# Patient Record
Sex: Female | Born: 1985 | Race: White | Hispanic: No | Marital: Single | Smoking: Former smoker
Health system: Southern US, Community
[De-identification: ages and names within clinical notes are randomized; demographics above are authoritative.]

## PROBLEM LIST (undated history)

## (undated) DIAGNOSIS — Z789 Other specified health status: Secondary | ICD-10-CM

## (undated) HISTORY — PX: TONGUE SURGERY: SHX810

---

## 2012-09-06 NOTE — L&D Delivery Note (Signed)
Delivery Note At 7:11 AM a viable female was delivered via Vaginal, Spontaneous Delivery (Presentation: ; Occiput Anterior).  APGAR: 8, 9; weight 7 lb 1.4 oz (3215 g).   Placenta status: Intact, Spontaneous.  Cord: 3 vessels with the following complications: None.  Cord pH: not collected  Anesthesia: Epidural  Episiotomy: None Lacerations: 1st degree Suture Repair: 3.0 vicryl Est. Blood Loss (mL): 300  Mom to postpartum.  Baby to Couplet care / Skin to Skin.  Routine postpartum  Breastfeeding  Outpatient circ  Haroldine Laws 07/15/2013, 9:09 AM

## 2013-04-19 LAB — OB RESULTS CONSOLE RUBELLA ANTIBODY, IGM: Rubella: IMMUNE

## 2013-04-19 LAB — OB RESULTS CONSOLE HEPATITIS B SURFACE ANTIGEN: Hepatitis B Surface Ag: NEGATIVE

## 2013-04-19 LAB — OB RESULTS CONSOLE RPR: RPR: NONREACTIVE

## 2013-04-19 LAB — OB RESULTS CONSOLE ANTIBODY SCREEN: Antibody Screen: NEGATIVE

## 2013-04-19 LAB — OB RESULTS CONSOLE HIV ANTIBODY (ROUTINE TESTING): HIV: NONREACTIVE

## 2013-06-15 LAB — OB RESULTS CONSOLE GBS: GBS: NEGATIVE

## 2013-07-04 ENCOUNTER — Encounter (HOSPITAL_COMMUNITY): Payer: Self-pay | Admitting: *Deleted

## 2013-07-04 ENCOUNTER — Inpatient Hospital Stay (HOSPITAL_COMMUNITY)
Admission: AD | Admit: 2013-07-04 | Discharge: 2013-07-05 | Disposition: A | Discharge status: Home / Self Care | Payer: Medicaid Other | Source: Ambulatory Visit | Attending: Obstetrics and Gynecology | Admitting: Obstetrics and Gynecology

## 2013-07-04 DIAGNOSIS — O479 False labor, unspecified: Secondary | ICD-10-CM | POA: Insufficient documentation

## 2013-07-04 HISTORY — DX: Other specified health status: Z78.9

## 2013-07-04 NOTE — MAU Note (Signed)
Pt states she started feeling pressure last night. Pt states she was 2/75/-2

## 2013-07-05 NOTE — MAU Provider Note (Signed)
History   27yo, [id number] at [redacted]w[redacted]d presents for MAU for labor check.  UCs reported to be 3-8 min apart.  Denies VB, LOF, recent fever, resp or GI c/o's, UTI or PIH s/s. GFM.   Chief Complaint  Patient presents with  . Contractions   The history is provided by the patient. No language interpreter was used.    OB History   Grav Para Term Preterm Abortions TAB SAB Ect Mult Living   4 2 2  0 1 0 1 0 0 2      Past Medical History  Diagnosis Date  . Medical history non-contributory     Past Surgical History  Procedure Laterality Date  . No past surgeries      No family history on file.  History  Substance Use Topics  . Smoking status: Not on file  . Smokeless tobacco: Not on file  . Alcohol Use: Not on file    Allergies:  Allergies  Allergen Reactions  . Demerol [Meperidine] Nausea And Vomiting    Pt states it makes her"throw up and makes her really sick"    No prescriptions prior to admission    ROS ROS: see HPI above, all other systems are negative  Physical Exam   Blood pressure 110/67, pulse 90, resp. rate 18.  Physical Exam  Vitals reviewed. Constitutional: She is oriented to person, place, and time. She appears well-developed and well-nourished.  HENT:  Head: Normocephalic.  Neck: Normal range of motion.  Cardiovascular: Normal rate and regular rhythm.   Respiratory: Effort normal.  GI: Soft.  gravid  Genitourinary: Vagina normal and uterus normal.  Musculoskeletal: Normal range of motion.  Neurological: She is alert and oriented to person, place, and time.  Skin: Skin is warm and dry.  Psychiatric: She has a normal mood and affect.    Dilation: 1 Effacement (%): 50 Cervical Position: Posterior Exam by:: J.OxleyCNM  FHT:  Reactive NST UCs:  Irregular  ED Course  IUP at [redacted]w[redacted]d Labor check  No cervical change after an hour D/c home with precautions F/u at [facility] 11/4 for already scheduled ROB  Haroldine Laws CNM, MSN 07/05/2013 12:46  AM

## 2013-07-06 ENCOUNTER — Encounter (HOSPITAL_COMMUNITY): Payer: Self-pay | Admitting: *Deleted

## 2013-07-06 ENCOUNTER — Telehealth (HOSPITAL_COMMUNITY): Payer: Self-pay | Admitting: *Deleted

## 2013-07-06 NOTE — Telephone Encounter (Signed)
Preadmission screen  

## 2013-07-14 ENCOUNTER — Inpatient Hospital Stay (HOSPITAL_COMMUNITY): Payer: Medicaid Other | Admitting: Anesthesiology

## 2013-07-14 ENCOUNTER — Inpatient Hospital Stay (HOSPITAL_COMMUNITY): Admission: RE | Admit: 2013-07-14 | Payer: Medicaid Other | Source: Ambulatory Visit

## 2013-07-14 ENCOUNTER — Encounter (HOSPITAL_COMMUNITY): Payer: Medicaid Other | Admitting: Anesthesiology

## 2013-07-14 ENCOUNTER — Encounter (HOSPITAL_COMMUNITY): Payer: Self-pay | Admitting: *Deleted

## 2013-07-14 ENCOUNTER — Inpatient Hospital Stay (HOSPITAL_COMMUNITY)
Admission: AD | Admit: 2013-07-14 | Discharge: 2013-07-15 | DRG: 775 | Disposition: A | Discharge status: Home / Self Care | Payer: Medicaid Other | Source: Ambulatory Visit | Attending: Obstetrics and Gynecology | Admitting: Obstetrics and Gynecology

## 2013-07-14 DIAGNOSIS — O480 Post-term pregnancy: Secondary | ICD-10-CM | POA: Diagnosis present

## 2013-07-14 DIAGNOSIS — O26849 Uterine size-date discrepancy, unspecified trimester: Secondary | ICD-10-CM | POA: Diagnosis present

## 2013-07-14 DIAGNOSIS — D649 Anemia, unspecified: Secondary | ICD-10-CM | POA: Diagnosis present

## 2013-07-14 DIAGNOSIS — O9903 Anemia complicating the puerperium: Secondary | ICD-10-CM | POA: Diagnosis not present

## 2013-07-14 DIAGNOSIS — O0930 Supervision of pregnancy with insufficient antenatal care, unspecified trimester: Secondary | ICD-10-CM

## 2013-07-14 DIAGNOSIS — O365990 Maternal care for other known or suspected poor fetal growth, unspecified trimester, not applicable or unspecified: Secondary | ICD-10-CM | POA: Diagnosis present

## 2013-07-14 LAB — CBC
Hemoglobin: 11.2 g/dL — ABNORMAL LOW (ref 12.0–15.0)
MCH: 29.2 pg (ref 26.0–34.0)
MCV: 84.9 fL (ref 78.0–100.0)
Platelets: 178 10*3/uL (ref 150–400)
RBC: 3.84 MIL/uL — ABNORMAL LOW (ref 3.87–5.11)
WBC: 12.8 10*3/uL — ABNORMAL HIGH (ref 4.0–10.5)

## 2013-07-14 MED ORDER — ONDANSETRON HCL 4 MG PO TABS: 4.0000 mg | ORAL_TABLET | ORAL | Status: DC | PRN

## 2013-07-14 MED ORDER — ACETAMINOPHEN 325 MG PO TABS: 650.0000 mg | ORAL_TABLET | ORAL | Status: DC | PRN

## 2013-07-14 MED ORDER — METOCLOPRAMIDE HCL 5 MG/ML IJ SOLN: 10.0000 mg | Freq: Three times a day (TID) | INTRAMUSCULAR | Status: DC | PRN

## 2013-07-14 MED ORDER — DIPHENHYDRAMINE HCL 50 MG/ML IJ SOLN: 12.5000 mg | INTRAMUSCULAR | Status: DC | PRN

## 2013-07-14 MED ORDER — LACTATED RINGERS IV SOLN: 500.0000 mL | Freq: Once | INTRAVENOUS | Status: DC

## 2013-07-14 MED ORDER — OXYTOCIN 40 UNITS IN LACTATED RINGERS INFUSION - SIMPLE MED
62.5000 mL/h | INTRAVENOUS | Status: DC
  Filled 2013-07-14: qty 1000

## 2013-07-14 MED ORDER — TETANUS-DIPHTH-ACELL PERTUSSIS 5-2.5-18.5 LF-MCG/0.5 IM SUSP: 0.5000 mL | Freq: Once | INTRAMUSCULAR | Status: DC

## 2013-07-14 MED ORDER — FENTANYL 2.5 MCG/ML BUPIVACAINE 1/10 % EPIDURAL INFUSION (WH - ANES)
14.0000 mL/h | INTRAMUSCULAR | Status: DC | PRN
  Filled 2013-07-14: qty 125

## 2013-07-14 MED ORDER — ONDANSETRON HCL 4 MG/2ML IJ SOLN: 4.0000 mg | INTRAMUSCULAR | Status: DC | PRN

## 2013-07-14 MED ORDER — SIMETHICONE 80 MG PO CHEW: 80.0000 mg | CHEWABLE_TABLET | ORAL | Status: DC | PRN

## 2013-07-14 MED ORDER — IBUPROFEN 600 MG PO TABS
600.0000 mg | ORAL_TABLET | Freq: Four times a day (QID) | ORAL | Status: DC
  Administered 2013-07-14 – 2013-07-15 (×4): 600 mg via ORAL
  Filled 2013-07-14 (×4): qty 1

## 2013-07-14 MED ORDER — NALOXONE HCL 0.4 MG/ML IJ SOLN: 0.4000 mg | INTRAMUSCULAR | Status: DC | PRN

## 2013-07-14 MED ORDER — OXYTOCIN BOLUS FROM INFUSION
500.0000 mL | INTRAVENOUS | Status: DC
  Administered 2013-07-14: 500 mL via INTRAVENOUS

## 2013-07-14 MED ORDER — ONDANSETRON HCL 4 MG/2ML IJ SOLN: 4.0000 mg | Freq: Four times a day (QID) | INTRAMUSCULAR | Status: DC | PRN

## 2013-07-14 MED ORDER — NALOXONE HCL 1 MG/ML IJ SOLN: 1.0000 ug/kg/h | INTRAVENOUS | Status: DC | PRN

## 2013-07-14 MED ORDER — SCOPOLAMINE 1 MG/3DAYS TD PT72: 1.0000 | MEDICATED_PATCH | Freq: Once | TRANSDERMAL | Status: DC

## 2013-07-14 MED ORDER — PHENYLEPHRINE 40 MCG/ML (10ML) SYRINGE FOR IV PUSH (FOR BLOOD PRESSURE SUPPORT)
80.0000 ug | PREFILLED_SYRINGE | INTRAVENOUS | Status: DC | PRN
  Filled 2013-07-14: qty 2
  Filled 2013-07-14: qty 10

## 2013-07-14 MED ORDER — OXYCODONE-ACETAMINOPHEN 5-325 MG PO TABS: 1.0000 | ORAL_TABLET | ORAL | Status: DC | PRN

## 2013-07-14 MED ORDER — LACTATED RINGERS IV SOLN
500.0000 mL | INTRAVENOUS | Status: DC | PRN
  Administered 2013-07-14: 500 mL via INTRAVENOUS

## 2013-07-14 MED ORDER — LANOLIN HYDROUS EX OINT: TOPICAL_OINTMENT | CUTANEOUS | Status: DC | PRN

## 2013-07-14 MED ORDER — SODIUM CHLORIDE 0.9 % IJ SOLN: 3.0000 mL | INTRAMUSCULAR | Status: DC | PRN

## 2013-07-14 MED ORDER — DIPHENHYDRAMINE HCL 25 MG PO CAPS: 25.0000 mg | ORAL_CAPSULE | ORAL | Status: DC | PRN

## 2013-07-14 MED ORDER — PRENATAL MULTIVITAMIN CH
1.0000 | ORAL_TABLET | Freq: Every day | ORAL | Status: DC
  Administered 2013-07-14 – 2013-07-15 (×2): 1 via ORAL
  Filled 2013-07-14 (×2): qty 1

## 2013-07-14 MED ORDER — BENZOCAINE-MENTHOL 20-0.5 % EX AERO
1.0000 "application " | INHALATION_SPRAY | CUTANEOUS | Status: DC | PRN
  Administered 2013-07-15: 1 via TOPICAL
  Filled 2013-07-14: qty 56

## 2013-07-14 MED ORDER — EPHEDRINE 5 MG/ML INJ
10.0000 mg | INTRAVENOUS | Status: DC | PRN
  Filled 2013-07-14: qty 2

## 2013-07-14 MED ORDER — EPHEDRINE 5 MG/ML INJ
10.0000 mg | INTRAVENOUS | Status: DC | PRN
  Filled 2013-07-14: qty 4
  Filled 2013-07-14: qty 2

## 2013-07-14 MED ORDER — PHENYLEPHRINE 40 MCG/ML (10ML) SYRINGE FOR IV PUSH (FOR BLOOD PRESSURE SUPPORT)
80.0000 ug | PREFILLED_SYRINGE | INTRAVENOUS | Status: DC | PRN
  Filled 2013-07-14: qty 2

## 2013-07-14 MED ORDER — WITCH HAZEL-GLYCERIN EX PADS: 1.0000 "application " | MEDICATED_PAD | CUTANEOUS | Status: DC | PRN

## 2013-07-14 MED ORDER — FENTANYL 2.5 MCG/ML BUPIVACAINE 1/10 % EPIDURAL INFUSION (WH - ANES)
INTRAMUSCULAR | Status: DC | PRN
  Administered 2013-07-14: 14 mL/h via EPIDURAL

## 2013-07-14 MED ORDER — ONDANSETRON HCL 4 MG/2ML IJ SOLN: 4.0000 mg | Freq: Three times a day (TID) | INTRAMUSCULAR | Status: DC | PRN

## 2013-07-14 MED ORDER — LIDOCAINE HCL (PF) 1 % IJ SOLN
30.0000 mL | INTRAMUSCULAR | Status: DC | PRN
  Filled 2013-07-14: qty 30

## 2013-07-14 MED ORDER — LIDOCAINE HCL (PF) 1 % IJ SOLN
INTRAMUSCULAR | Status: DC | PRN
  Administered 2013-07-14 (×2): 4 mL

## 2013-07-14 MED ORDER — FENTANYL 2.5 MCG/ML BUPIVACAINE 1/10 % EPIDURAL INFUSION (WH - ANES): 12.0000 mL/h | INTRAMUSCULAR | Status: DC | PRN

## 2013-07-14 MED ORDER — ZOLPIDEM TARTRATE 5 MG PO TABS: 5.0000 mg | ORAL_TABLET | Freq: Every evening | ORAL | Status: DC | PRN

## 2013-07-14 MED ORDER — DIPHENHYDRAMINE HCL 25 MG PO CAPS: 25.0000 mg | ORAL_CAPSULE | Freq: Four times a day (QID) | ORAL | Status: DC | PRN

## 2013-07-14 MED ORDER — IBUPROFEN 600 MG PO TABS
600.0000 mg | ORAL_TABLET | Freq: Four times a day (QID) | ORAL | Status: DC | PRN
  Administered 2013-07-14: 600 mg via ORAL
  Filled 2013-07-14: qty 1

## 2013-07-14 MED ORDER — DIPHENHYDRAMINE HCL 50 MG/ML IJ SOLN: 25.0000 mg | INTRAMUSCULAR | Status: DC | PRN

## 2013-07-14 MED ORDER — NALBUPHINE HCL 10 MG/ML IJ SOLN
5.0000 mg | INTRAMUSCULAR | Status: DC | PRN
  Filled 2013-07-14: qty 1

## 2013-07-14 MED ORDER — LACTATED RINGERS IV SOLN
INTRAVENOUS | Status: DC
  Administered 2013-07-14: 05:00:00 via INTRAVENOUS

## 2013-07-14 MED ORDER — SENNOSIDES-DOCUSATE SODIUM 8.6-50 MG PO TABS
2.0000 | ORAL_TABLET | ORAL | Status: DC
  Administered 2013-07-15: 2 via ORAL
  Filled 2013-07-14: qty 2

## 2013-07-14 MED ORDER — NALBUPHINE HCL 10 MG/ML IJ SOLN
5.0000 mg | INTRAMUSCULAR | Status: DC | PRN
Start: 2013-07-14 — End: 2013-07-15
  Filled 2013-07-14: qty 1

## 2013-07-14 MED ORDER — DIBUCAINE 1 % RE OINT: 1.0000 "application " | TOPICAL_OINTMENT | RECTAL | Status: DC | PRN

## 2013-07-14 MED ORDER — CITRIC ACID-SODIUM CITRATE 334-500 MG/5ML PO SOLN: 30.0000 mL | ORAL | Status: DC | PRN

## 2013-07-14 NOTE — Progress Notes (Signed)
Haroldine Laws CNM notified of pt's status. Aware of sve, ctx pattern, desires epidural. Will admit to [facility]

## 2013-07-14 NOTE — MAU Note (Signed)
Contractions since 2330. No leaking or bleeding

## 2013-07-14 NOTE — H&P (Signed)
Allison Nielsen is a 27 y.o. female, [id number] at [redacted]w[redacted]d, presenting for active labor, UCs since 2330.  Denies VB, LOF, recent fever, resp or GI c/o's, UTI or PIH s/s. GFM. Desires epidural.  Patient Active Problem Ketchem   Diagnosis Date Noted  . NSVD (normal spontaneous vaginal delivery) 07/15/2013  . First-degree perineal laceration, with delivery 07/15/2013  . Anemia 07/15/2013  . Uterine size date discrepancy 07/15/2013  . Maternal care for poor fetal growth 07/15/2013  . Fetal growth restriction 07/15/2013  . Late prenatal care 07/15/2013    History of present pregnancy: Patient entered care at 29 weeks.   EDC of 07/07/13 was established by LMP.   Anatomy scan:  29 weeks, with normal findings and an posterior placenta.    Additional [place] evaluations:   Growth at [redacted]w[redacted]d for S<D - IUGR: EFW 1826g (6.5%), BPP 8/8, Dopplers 50% for GA, AFI normal.   Dopplers at [redacted]w[redacted]d - dopplers normal, no loss or reversal of diastolic flow, S/D = 2.56 (50th%ile)\ Growth at [redacted]w[redacted]d - vtx, AFI 70th%ile, EFW 23rd%ile, AC 6th%ile, BPP 8/10, umbilical dopplers normal   Significant prenatal events:  Late entry to care, IUGR noted at [redacted]w[redacted]d (see above)   Last evaluation:  07/10/13 at [redacted]w[redacted]d   3 cm / 40% / -3  OB History   Grav Para Term Preterm Abortions TAB SAB Ect Mult Living   4 3 3  0 1 0 1 0 0 3     Past Medical History  Diagnosis Date  . Medical history non-contributory    Past Surgical History  Procedure Laterality Date  . No past surgeries     Family History: family history includes Cancer in her maternal grandmother; Diabetes in her maternal grandfather. Social History:  reports that she has quit smoking. She does not have any smokeless tobacco history on file. She reports that she does not drink alcohol or use illicit drugs.   Prenatal Transfer Tool  Maternal Diabetes: No Genetic Screening: Declined Maternal Ultrasounds/Referrals: Abnormal:  Findings:   IUGR Fetal Ultrasounds or other Referrals:   NoneDoppler WNL Maternal Substance Abuse:  No Significant Maternal Medications:  None Significant Maternal Lab Results: Lab values include: Group B Strep negative    ROS: see HPI above, all other systems are negative  Allergies  Allergen Reactions  . Demerol [Meperidine] Nausea And Vomiting    Pt states it makes her"throw up and makes her really sick"      Blood pressure 100/58, pulse 81, temperature 98.5 F (36.9 C), temperature source Oral, resp. rate 16, height 5\' 4"  (1.626 m), weight 87.998 kg (194 lb), last menstrual period 09/30/2012, SpO2 97.00%, unknown if currently breastfeeding.  Chest clear Heart RRR without murmur Abd gravid, NT Pelvic: 9 cm / 100% / 0 Ext: WNL  FHR: Reactive NST UCs:  Q 4-7 min  Prenatal labs: ABO, Rh: O/Positive/-- (08/14 0000) Antibody: Negative (08/14 0000) Rubella:   Immune RPR: NON REACTIVE (11/08 0430)  HBsAg: Negative (08/14 0000)  HIV: Non-reactive (08/14 0000)  GBS: Negative (10/10 0000) Sickle cell/Hgb electrophoresis:  n/a Pap:  Last pap unknown GC:  Neg Chlamydia:  Neg Genetic screenings:  Declined Glucola:  104 Other:  none    Assessment/Plan: IUP at [redacted]w[redacted]d Active labor GBS neg Desires epidural  Admit to BS per consult with Dr. Dion Body Routine [facility] orders Epidural prn  Rowan Blase, MSN 07/15/2013, 11:29 AM

## 2013-07-14 NOTE — Lactation Note (Signed)
This note was copied from the chart of [name]. Lactation Consultation Note Initial visit at 12 hours of age.  [facility] LC resources given and discussed.  Mom has experience with breastfeeding 2 other babies with tongue ties and difficulty.  Mom reports pediatrician wants to wait a minimum of 24 hours before clipping frenulum if needed.  Assessment reveals tight frenulum and limited tongue extension over gum line. Mom reports pain with latching, but not with pumping.  #20 Nipple shield used to assist with latch.  Baby just got bathed and is skin to skin with some feeding cues.  Baby attempts shallow latch, mom reports to much pain to reposition.  Nipple compressed with positional stripe.  #24 nipple shield used with little improvement in pain and latch, although it appear to fit better.  Mom reports bottle with formula less than 2 hours ago. Mom wants to pump and bottle during the night due to pain.  Encouraged mom to hand express and spoon feed if she can and pump for stimulation.  Hand expression demonstrated with colostrum visible. Mom to call Rn as needed for assistance.   Patient Name: Allison Nielsen Today's Date: 07/14/2013 Reason for consult: Initial assessment;Difficult latch;Breast/nipple pain   Maternal Data Formula Feeding for Exclusion: No Has patient been taught Hand Expression?: Yes Does the patient have breastfeeding experience prior to this delivery?: Yes  Feeding Feeding Type: Breast Fed  LATCH Score/Interventions Latch: Repeated attempts needed to sustain latch, nipple held in mouth throughout feeding, stimulation needed to elicit sucking reflex. Intervention(s): Adjust position;Assist with latch;Breast massage;Breast compression  Audible Swallowing: None Intervention(s): Skin to skin;Hand expression Intervention(s): Alternate breast massage;Hand expression;Skin to skin  Type of Nipple: Flat Intervention(s): Reverse pressure;Double electric pump  Comfort (Breast/Nipple):  Filling, red/small blisters or bruises, mild/mod discomfort  Problem noted: Mild/Moderate discomfort Interventions (Mild/moderate discomfort): Hand expression;Hand massage;Reverse pressue;Breast shields  Hold (Positioning): No assistance needed to correctly position infant at breast.  LATCH Score: 5  Lactation Tools Discussed/Used Tools: Nipple Shields Nipple shield size: 20;24   Consult Status Consult Status: Follow-up Date: 07/15/13 Follow-up type: In-patient    Shoptaw, Arvella Merles 07/14/2013, 8:59 PM

## 2013-07-14 NOTE — MAU Note (Signed)
Report called to Dana RN in Bs.  

## 2013-07-14 NOTE — Anesthesia Preprocedure Evaluation (Addendum)
Anesthesia Evaluation  Patient identified by MRN, date of birth, ID band Patient awake    Reviewed: Allergy & Precautions, H&P , NPO status , Patient's Chart, lab work & pertinent test results  Airway Mallampati: II TM Distance: >3 FB     Dental  (+) Dental Advisory Given   Pulmonary neg pulmonary ROS, former smoker,          Cardiovascular negative cardio ROS      Neuro/Psych negative neurological ROS  negative psych ROS   GI/Hepatic negative GI ROS, Neg liver ROS,   Endo/Other  negative endocrine ROS  Renal/GU negative Renal ROS     Musculoskeletal negative musculoskeletal ROS (+)   Abdominal   Peds  Hematology negative hematology ROS (+)   Anesthesia Other Findings   Reproductive/Obstetrics (+) Pregnancy                          Anesthesia Physical Anesthesia Plan  ASA: II  Anesthesia Plan: Epidural   Post-op Pain Management:    Induction:   Airway Management Planned:   Additional Equipment:   Intra-op Plan:   Post-operative Plan:   Informed Consent: I have reviewed the patients History and Physical, chart, labs and discussed the procedure including the risks, benefits and alternatives for the proposed anesthesia with the patient or authorized representative who has indicated his/her understanding and acceptance.     Plan Discussed with:   Anesthesia Plan Comments:         Anesthesia Quick Evaluation

## 2013-07-14 NOTE — Progress Notes (Signed)
To BS via w/c °

## 2013-07-14 NOTE — Anesthesia Procedure Notes (Signed)
Epidural Patient location during procedure: OB Start time: 07/14/2013 5:15 AM End time: 07/14/2013 5:25 AM  Staffing Anesthesiologist: Lewie Loron R Performed by: anesthesiologist   Preanesthetic Checklist Completed: patient identified, pre-op evaluation, timeout performed, IV checked, risks and benefits discussed and monitors and equipment checked  Epidural Patient position: sitting Prep: site prepped and draped and DuraPrep Patient monitoring: heart rate, continuous pulse ox and blood pressure Approach: midline Injection technique: LOR air and LOR saline  Needle:  Needle type: Tuohy  Needle gauge: 17 G Needle length: 9 cm Needle insertion depth: 5 cm Catheter type: closed end flexible Catheter size: 19 Gauge Catheter at skin depth: 11 cm Test dose: negative  Assessment Events: blood not aspirated, injection not painful, no injection resistance, negative IV test and no paresthesia  Additional Notes Reason for block:procedure for pain

## 2013-07-15 DIAGNOSIS — O365990 Maternal care for other known or suspected poor fetal growth, unspecified trimester, not applicable or unspecified: Secondary | ICD-10-CM | POA: Insufficient documentation

## 2013-07-15 DIAGNOSIS — O0930 Supervision of pregnancy with insufficient antenatal care, unspecified trimester: Secondary | ICD-10-CM | POA: Insufficient documentation

## 2013-07-15 DIAGNOSIS — IMO0002 Reserved for concepts with insufficient information to code with codable children: Secondary | ICD-10-CM | POA: Insufficient documentation

## 2013-07-15 DIAGNOSIS — O26849 Uterine size-date discrepancy, unspecified trimester: Secondary | ICD-10-CM | POA: Insufficient documentation

## 2013-07-15 DIAGNOSIS — D649 Anemia, unspecified: Secondary | ICD-10-CM | POA: Diagnosis present

## 2013-07-15 LAB — CBC
MCHC: 34 g/dL (ref 30.0–36.0)
Platelets: 155 10*3/uL (ref 150–400)
RDW: 14.4 % (ref 11.5–15.5)
WBC: 11.3 10*3/uL — ABNORMAL HIGH (ref 4.0–10.5)

## 2013-07-15 MED ORDER — OXYCODONE-ACETAMINOPHEN 5-325 MG PO TABS: 1.0000 | ORAL_TABLET | ORAL | Status: DC | PRN

## 2013-07-15 MED ORDER — FERROUS SULFATE 325 (65 FE) MG PO TABS: 325.0000 mg | ORAL_TABLET | Freq: Two times a day (BID) | ORAL | Status: DC

## 2013-07-15 MED ORDER — IBUPROFEN 600 MG PO TABS: 600.0000 mg | ORAL_TABLET | Freq: Four times a day (QID) | ORAL | Status: DC

## 2013-07-15 NOTE — Progress Notes (Signed)
Discharge instructions provided to patient at bedside.  Activity, follow up appointments, medications, community resources, when to call the doctor and infant care discussed.  No questions at this time.  Patient left unit in stable condition with all personal belongings and prescription.  Patient left unit accompanied by staff with infant secured in car seat to nursery for HUGS tag removal.  Osvaldo Angst, RN------

## 2013-07-15 NOTE — Discharge Summary (Signed)
Vaginal Delivery Discharge Summary  Allison Nielsen  DOB:    05/02/1986 MRN:    [id number] CSN:    [id number]  Date of admission:                  07/14/13  Date of discharge:                   07/15/13  Procedures this admission: SVD with 1st degree laceration  Date of Delivery: 07/14/13 by Haroldine Laws  Newborn Data:  Live born female  Birth Weight: 7 lb 1.4 oz (3215 g) APGAR: 8, 9  Home with mother. Circumcision Plan: Outpatient   History of Present Illness:  Allison Nielsen is a 27 y.o. female, [phone], who presents at [redacted]w[redacted]d weeks gestation. The patient has been followed at the [facility] division of [facility]. She was admitted onset of labor. Her pregnancy has been complicated by:  Patient Active Problem [place]   Diagnosis Date Noted  . NSVD (normal spontaneous vaginal delivery) 07/15/2013  . First-degree perineal laceration, with delivery 07/15/2013  . Anemia 07/15/2013   Hospital course:  The patient was admitted for active labor.   Her labor was not complicated. She proceeded to have a vaginal delivery of a healthy infant. Her delivery was not complicated. Her postpartum course was complicated by asymptomatic anemia.  She was discharged to home on postpartum day 1 doing well.  Feeding:  Pumping and bottle feeding  Contraception:  Depo-Provera  Discharge hemoglobin:  Hemoglobin  Date Value Range Status  07/15/2013 9.8* 12.0 - 15.0 g/dL Final     HCT  Date Value Range Status  07/15/2013 28.8* 36.0 - 46.0 % Final    Discharge Physical Exam:   General: alert, cooperative and no distress Lochia: appropriate Uterine Fundus: firm Incision: healing well DVT Evaluation: No evidence of DVT seen on physical exam. Negative Homan's sign.  Intrapartum Procedures: spontaneous vaginal delivery Postpartum Procedures: none Complications-Operative and Postpartum: 1st degree perineal laceration  Discharge Diagnoses: Term  Pregnancy-delivered  Discharge Information:  Activity:           pelvic rest Diet:                routine Medications: PNV, Ibuprofen, Colace, Iron and Percocet Condition:      stable Instructions:   Postpartum Care After Vaginal Delivery  After you deliver your newborn (postpartum period), the usual stay in the hospital is 24 72 hours. If there were problems with your labor or delivery, or if you have other medical problems, you might be in the hospital longer.  While you are in the hospital, you will receive help and instructions on how to care for yourself and your newborn during the postpartum period.  While you are in the hospital:  Be sure to tell your nurses if you have pain or discomfort, as well as where you feel the pain and what makes the pain worse.  If you had an incision made near your vagina (episiotomy) or if you had some tearing during delivery, the nurses may put ice packs on your episiotomy or tear. The ice packs may help to reduce the pain and swelling.  If you are breastfeeding, you may feel uncomfortable contractions of your uterus for a couple of weeks. This is normal. The contractions help your uterus get back to normal size.  It is normal to have some bleeding after delivery.  For the first 1 3 days after delivery,  the flow is red and the amount may be similar to a period.  It is common for the flow to start and stop.  In the first few days, you may pass some small clots. Let your nurses know if you begin to pass large clots or your flow increases.  Do not  flush blood clots down the toilet before having the nurse look at them.  During the next 3 10 days after delivery, your flow should become more watery and pink or brown-tinged in color.  Ten to fourteen days after delivery, your flow should be a small amount of yellowish-white discharge.  The amount of your flow will decrease over the first few weeks after delivery. Your flow may stop in 6 8 weeks. Most  women have had their flow stop by 12 weeks after delivery.  You should change your sanitary pads frequently.  Wash your hands thoroughly with soap and water for at least 20 seconds after changing pads, using the toilet, or before holding or feeding your newborn.  You should feel like you need to empty your bladder within the first 6 8 hours after delivery.  In case you become weak, lightheaded, or faint, call your nurse before you get out of bed for the first time and before you take a shower for the first time.  Within the first few days after delivery, your breasts may begin to feel tender and full. This is called engorgement. Breast tenderness usually goes away within 48 72 hours after engorgement occurs. You may also notice milk leaking from your breasts. If you are not breastfeeding, do not stimulate your breasts. Breast stimulation can make your breasts produce more milk.  Spending as much time as possible with your newborn is very important. During this time, you and your newborn can feel close and get to know each other. Having your newborn stay in your room (rooming in) will help to strengthen the bond with your newborn. It will give you time to get to know your newborn and become comfortable caring for your newborn.  Your hormones change after delivery. Sometimes the hormone changes can temporarily cause you to feel sad or tearful. These feelings should not last more than a few days. If these feelings last longer than that, you should talk to your caregiver.  If desired, talk to your caregiver about methods of family planning or contraception.  Talk to your caregiver about immunizations. Your caregiver may want you to have the following immunizations before leaving the hospital:  Tetanus, diphtheria, and pertussis (Tdap) or tetanus and diphtheria (Td) immunization. It is very important that you and your family (including grandparents) or others caring for your newborn are up-to-date  with the Tdap or Td immunizations. The Tdap or Td immunization can help protect your newborn from getting ill.  Rubella immunization.  Varicella (chickenpox) immunization.  Influenza immunization. You should receive this annual immunization if you did not receive the immunization during your pregnancy. Document Released: 06/20/2007 Document Revised: 05/17/2012 Document Reviewed: 04/19/2012 [facility] Patient Information 2014 [place], [place].   Postpartum Depression and Baby Blues  The postpartum period begins right after the birth of a baby. During this time, there is often a great amount of joy and excitement. It is also a time of considerable changes in the life of the parent(s). Regardless of how many times a mother gives birth, each child brings new challenges and dynamics to the family. It is not unusual to have feelings of excitement accompanied by confusing  shifts in moods, emotions, and thoughts. All mothers are at risk of developing postpartum depression or the "baby blues." These mood changes can occur right after giving birth, or they may occur many months after giving birth. The baby blues or postpartum depression can be mild or severe. Additionally, postpartum depression can resolve rather quickly, or it can be a long-term condition. CAUSES Elevated hormones and their rapid decline are thought to be a main cause of postpartum depression and the baby blues. There are a number of hormones that radically change during and after pregnancy. Estrogen and progesterone usually decrease immediately after delivering your baby. The level of thyroid hormone and various cortisol steroids also rapidly drop. Other factors that play a major role in these changes include major life events and genetics.  RISK FACTORS If you have any of the following risks for the baby blues or postpartum depression, know what symptoms to watch out for during the postpartum period. Risk factors that may increase the  likelihood of getting the baby blues or postpartum depression include:  Havinga personal or family history of depression.  Having depression while being pregnant.  Having premenstrual or oral contraceptive-associated mood issues.  Having exceptional life stress.  Having marital conflict.  Lacking a social support network.  Having a baby with special needs.  Having health problems such as diabetes. SYMPTOMS Baby blues symptoms include:  Brief fluctuations in mood, such as going from extreme happiness to sadness.  Decreased concentration.  Difficulty sleeping.  Crying spells, tearfulness.  Irritability.  Anxiety. Postpartum depression symptoms typically begin within the first month after giving birth. These symptoms include:  Difficulty sleeping or excessive sleepiness.  Marked weight loss.  Agitation.  Feelings of worthlessness.  Lack of interest in activity or food. Postpartum psychosis is a very concerning condition and can be dangerous. Fortunately, it is rare. Displaying any of the following symptoms is cause for immediate medical attention. Postpartum psychosis symptoms include:  Hallucinations and delusions.  Bizarre or disorganized behavior.  Confusion or disorientation. DIAGNOSIS  A diagnosis is made by an evaluation of your symptoms. There are no medical or lab tests that lead to a diagnosis, but there are various questionnaires that a caregiver may use to identify those with the baby blues, postpartum depression, or psychosis. Often times, a screening tool called the [place] Postnatal Depression Scale is used to diagnose depression in the postpartum period.  TREATMENT The baby blues usually goes away on its own in 1 to 2 weeks. Social support is often all that is needed. You should be encouraged to get adequate sleep and rest. Occasionally, you may be given medicines to help you sleep.  Postpartum depression requires treatment as it can last several  months or longer if it is not treated. Treatment may include individual or group therapy, medicine, or both to address any social, physiological, and psychological factors that may play a role in the depression. Regular exercise, a healthy diet, rest, and social support may also be strongly recommended.  Postpartum psychosis is more serious and needs treatment right away. Hospitalization is often needed. HOME CARE INSTRUCTIONS  Get as much rest as you can. Nap when the baby sleeps.  Exercise regularly. Some women find yoga and walking to be beneficial.  Eat a balanced and nourishing diet.  Do little things that you enjoy. Have a cup of tea, take a bubble bath, read your favorite magazine, or listen to your favorite music.  Avoid alcohol.  Ask for help with household chores,  cooking, grocery shopping, or running errands as needed. Do not try to do everything.  Talk to people close to you about how you are feeling. Get support from your partner, family members, friends, or other new moms.  Try to stay positive in how you think. Think about the things you are grateful for.  Do not spend a lot of time alone.  Only take medicine as directed by your caregiver.  Keep all your postpartum appointments.  Let your caregiver know if you have any concerns. SEEK MEDICAL CARE IF: You are having a reaction or problems with your medicine. SEEK IMMEDIATE MEDICAL CARE IF:  You have suicidal feelings.  You feel you may harm the baby or someone else. Document Released: 05/27/2004 Document Revised: 11/15/2011 Document Reviewed: 06/29/2011 [facility] Patient Information 2014 [place], [place].   Discharge to: home  Follow-up Information   Follow up with [facility]. Schedule an appointment as soon as possible for a visit in 5 weeks. (Call with any questions or concerns.)    Specialty:  Obstetrics and Gynecology   Contact information:   [address] [place] [address] [phone]       Haroldine Laws 07/15/2013

## 2013-07-15 NOTE — Progress Notes (Signed)
Clinical Social Work Department PSYCHOSOCIAL ASSESSMENT - MATERNAL/CHILD 07/15/2013  Patient:  RAMEY, KETCHERSIDE  Account Number:  [id number]  Admit Date:  07/14/2013  Marjo Bicker Name:   Annitta Needs    Clinical Social Worker:  Deidra Spease, LCSW   Date/Time:  07/15/2013 10:45 AM  Date Referred:  07/15/2013   Referral source  CSW     Referred reason  [facility]   Other referral source:    I:  FAMILY / HOME ENVIRONMENT Child's legal guardian:  PARENT  Guardian - Name Guardian - Age Guardian - Address  Mathwig,Jamella [address] [place], [place] [address]  Consuello Masse  same as above   Other household support members/support persons Other support:    II  PSYCHOSOCIAL DATA Information Source:    Event organiser Employment:   Both parents employed   Surveyor, quantity resources:  [employer] If [employer] - County:    School / Grade:   Maternity Care Coordinator / Child Services Coordination / Early Interventions:  Cultural issues impacting care:    III  STRENGTHS  Strength comment:    IV  RISK FACTORS AND CURRENT PROBLEMS Current Problem:       V  SOCIAL WORK ASSESSMENT Met with both parents who were pleasant and receptive to social work intervention.  Parents are not married,  and have two  other dependents ages 59 and 71 months.  Parents recently relocated from [place] because of mother's work. Informed that she received limited PNC because she was not aware of the pregnancy until she was 20 weeks and her insurance terminated because she changed jobs.  Informed that she applied for Medicaid and was unable to start [facility] until insurance issue was resolved.   She denies any hx of substance abuse or mental illness.  UDS on infant is negative.  Parents informed of reason for UDS and were understanding.  No acute social concerns reported or noted at this time.  Parents informed of social work Surveyor, mining.      VI SOCIAL WORK PLAN Social Work Plan  No Further Intervention  Required / No Barriers to Discharge   Broderic Bara J, LCSW

## 2013-07-15 NOTE — Anesthesia Postprocedure Evaluation (Signed)
  Anesthesia Post-op Note  Patient: Allison Nielsen  Procedure(s) Performed: * No procedures listed *  Patient Location: Mother/Baby  Anesthesia Type:Epidural  Level of Consciousness: awake and alert   Airway and Oxygen Therapy: Patient Spontanous Breathing  Post-op Pain: mild  Post-op Assessment: Patient's Cardiovascular Status Stable, Respiratory Function Stable, No signs of Nausea or vomiting, Pain level controlled, No headache, No residual numbness and No residual motor weakness  Post-op Vital Signs: stable  Complications: No apparent anesthesia complications

## 2013-07-16 NOTE — Progress Notes (Signed)
Ur chart review completed.  

## 2013-07-25 NOTE — Progress Notes (Signed)
CSW reported positive meconium (marijuana) results to [facility] CPS.

## 2013-08-21 ENCOUNTER — Encounter (HOSPITAL_COMMUNITY)
Admission: RE | Admit: 2013-08-21 | Discharge: 2013-08-21 | Disposition: A | Discharge status: Home / Self Care | Payer: Medicaid Other | Source: Ambulatory Visit | Attending: Obstetrics and Gynecology | Admitting: Obstetrics and Gynecology

## 2013-08-21 DIAGNOSIS — O923 Agalactia: Secondary | ICD-10-CM | POA: Insufficient documentation

## 2013-09-21 ENCOUNTER — Encounter (HOSPITAL_COMMUNITY)
Admission: RE | Admit: 2013-09-21 | Discharge: 2013-09-21 | Disposition: A | Discharge status: Home / Self Care | Payer: Medicaid Other | Source: Ambulatory Visit | Attending: Obstetrics and Gynecology | Admitting: Obstetrics and Gynecology

## 2013-09-21 DIAGNOSIS — O923 Agalactia: Secondary | ICD-10-CM | POA: Insufficient documentation

## 2014-07-08 ENCOUNTER — Encounter (HOSPITAL_COMMUNITY): Payer: Self-pay | Admitting: *Deleted

## 2015-09-07 NOTE — L&D Delivery Note (Signed)
Final Labor Progress Note In to check on patient, feeling very uncomfortable with epidural.  VE C/C/+2.  FHR remained reassuring.  Vaginal Delivery Note The pt utilized an epidural as pain management.   Spontaneous rupture of membranes yesterday, at 1232, clear.  GBS was negative.  Cervical dilation was complete at  0028.  NICHD Category 1.    Pushing with guidance began at  0030.   After 4 minutes of pushing the head, shoulders and the body of a viable female infant "Bruna PotterSilas" delivered spontaneously with maternal effort in the OA position at 0034.  With vigorous tone and spontaneous cry, the infant was placed on moms abd.  After the umbilical cord was clamped it was cut by the FOB, then cord blood was obtained for evaluation.  Spontaneous delivery of a intact placenta with a 3 vessel cord via Shultz at 0041.   Episiotomy: None   The vulva, perineum, vaginal vault, rectum and cervix were inspected and revealed a Superficial perineal which was repaired using a 4-0 vicryl on a SH needle. Lidocaine was not used, the epidural was sufficient for the repair. The rectum sphincter intact after the repair. Patient tolerated repair well.   Postpartum pitocin as ordered.  Fundus firm, lochia minimum, bleeding under control.   EBL 200, Pt hemodynamically stable.   Sponge, laps and needle count correct and verified with the primary care nurse.  Attending MD available at all times. Routine postpartum orders  Mother desires Depo  for contraception.  Mom plans to breastfeed Infant to have outpatient circumcision   Placenta to pathology: NO     Cord Gases sent to lab: NO Cord blood sent to lab: YES   APGARS:  9 at 1 minute and 9 at 5 minutes Weight:.  TBD   Both mom and baby were left in stable condition, baby skin to skin.   Alphonzo Severanceachel Teala Daffron, CNM, MSN 11/20/2015. 1:08 AM

## 2015-09-19 LAB — OB RESULTS CONSOLE GC/CHLAMYDIA
Chlamydia: NEGATIVE
Gonorrhea: NEGATIVE

## 2015-09-19 LAB — OB RESULTS CONSOLE RUBELLA ANTIBODY, IGM: Rubella: IMMUNE

## 2015-09-19 LAB — OB RESULTS CONSOLE RPR: RPR: NONREACTIVE

## 2015-09-19 LAB — OB RESULTS CONSOLE HIV ANTIBODY (ROUTINE TESTING): HIV: NONREACTIVE

## 2015-09-19 LAB — OB RESULTS CONSOLE ABO/RH: RH TYPE: POSITIVE

## 2015-09-19 LAB — OB RESULTS CONSOLE HEPATITIS B SURFACE ANTIGEN: HEP B S AG: NEGATIVE

## 2015-10-07 ENCOUNTER — Other Ambulatory Visit (HOSPITAL_COMMUNITY): Payer: Self-pay | Admitting: Obstetrics and Gynecology

## 2015-10-07 DIAGNOSIS — O358XX0 Maternal care for other (suspected) fetal abnormality and damage, not applicable or unspecified: Secondary | ICD-10-CM

## 2015-10-07 DIAGNOSIS — O35EXX0 Maternal care for other (suspected) fetal abnormality and damage, fetal genitourinary anomalies, not applicable or unspecified: Secondary | ICD-10-CM

## 2015-10-14 ENCOUNTER — Ambulatory Visit (HOSPITAL_COMMUNITY)
Admission: RE | Admit: 2015-10-14 | Discharge: 2015-10-14 | Disposition: A | Discharge status: Home / Self Care | Payer: Medicaid Other | Source: Ambulatory Visit | Attending: Obstetrics and Gynecology | Admitting: Obstetrics and Gynecology

## 2015-10-14 ENCOUNTER — Other Ambulatory Visit (HOSPITAL_COMMUNITY): Payer: Self-pay | Admitting: Obstetrics and Gynecology

## 2015-10-14 ENCOUNTER — Encounter (HOSPITAL_COMMUNITY): Payer: Self-pay

## 2015-10-14 DIAGNOSIS — Z3689 Encounter for other specified antenatal screening: Secondary | ICD-10-CM

## 2015-10-14 DIAGNOSIS — Z36 Encounter for antenatal screening of mother: Secondary | ICD-10-CM | POA: Insufficient documentation

## 2015-10-14 DIAGNOSIS — O359XX0 Maternal care for (suspected) fetal abnormality and damage, unspecified, not applicable or unspecified: Secondary | ICD-10-CM | POA: Insufficient documentation

## 2015-10-14 DIAGNOSIS — O358XX0 Maternal care for other (suspected) fetal abnormality and damage, not applicable or unspecified: Secondary | ICD-10-CM

## 2015-10-14 DIAGNOSIS — Z3A31 31 weeks gestation of pregnancy: Secondary | ICD-10-CM | POA: Diagnosis not present

## 2015-10-14 DIAGNOSIS — Q638 Other specified congenital malformations of kidney: Secondary | ICD-10-CM | POA: Diagnosis present

## 2015-10-14 DIAGNOSIS — O35EXX0 Maternal care for other (suspected) fetal abnormality and damage, fetal genitourinary anomalies, not applicable or unspecified: Secondary | ICD-10-CM

## 2015-10-14 DIAGNOSIS — O0933 Supervision of pregnancy with insufficient antenatal care, third trimester: Secondary | ICD-10-CM

## 2015-10-14 NOTE — Progress Notes (Signed)
MATERNAL FETAL MEDICINE CONSULT  Patient Name: Allison Nielsen Medical Record Number:  [id number] Date of Birth: 03-Mar-1986 Requesting Physician Name:  Jaymes Graff, MD Date of Service: 10/14/2015  Chief Complaint Fetal urinary tract dilation  History of Present Illness Allison Nielsen was seen today secondary to left sided fetal urinary tract dilation at the request of Jaymes Graff, MD.  The patient is a 30 y.o. [id number],XB [redacted]w[redacted]d with an EDD of 12/12/2015, by Last Menstrual Period dating method.  She was noted to have both ventriculomegaly and urinary tract dilation on a recent ultrasound performed in her primary OBs office.  She has no other issues with this pregnancy and has no acute complaints.  Review of Systems Pertinent items are noted in HPI.  Patient History OB History  Gravida Para Term Preterm AB SAB TAB Ectopic Multiple Living  0 1 1 0 0 0 3    # Outcome Date GA Lbr Len/2nd Weight Sex Delivery Anes PTL Lv  5 Current           4 Term 07/14/13 [redacted]w[redacted]d 02:28 / 00:47 7 lb 1.4 oz (3.215 kg) M Vag-Spont EPI  Y  3 SAB           2 Term           1 Term               Past Medical History  Diagnosis Date  . Medical history non-contributory     Past Surgical History  Procedure Laterality Date  . Tongue surgery      Social History   Social History  . Marital Status: Single    Spouse Name: N/A  . Number of Children: N/A  . Years of Education: N/A   Social History Main Topics  . Smoking status: Former Games developer  . Smokeless tobacco: Not on file  . Alcohol Use: No  . Drug Use: No  . Sexual Activity: Yes   Other Topics Concern  . Not on file   Social History Narrative    Family History  Problem Relation Age of Onset  . Cancer Maternal Grandmother   . Diabetes Maternal Grandfather    In addition, the patient has no family history of mental retardation, birth defects, or genetic diseases.  Physical Examination Vitals - BP 119/56, Pulse 77, Weight 179 lbs. General  appearance - alert, well appearing, and in no distress  Assessment and Recommendations 1.  Left sided urinary tract dilation.  Today's ultrasound showed the left renal pelvis measured 7 mm without dilation of the calyces.  The right kidney and amniotic fluid volume were normal.  While urinary tract dilation is associated with an increased risk of aneuploidy, this risk is not appreciably increased when it is discovered in isolation, as it is in Ms. Catino's case.  However, it is associated with and increased risk of a congenital anomaly of the kidneys and urinary tract.  Ms. Holck will return in 3 weeks to reassess fetal anatomy. 2.  Ventriculomegaly.  Although this was seen on a prior ultrasound, the cerebral ventricles were both measuring normally today.  We will reassess the cerebral ventricles when she returns in 3 weeks.  I spent 15 minutes with Ms. Zahm today of which 50% was face-to-face counseling.  Thank you for referring Ms. Pelto to the [facility]  Please do not hesitate to contact us with questions.   Rema Fendt, MD

## 2015-11-04 ENCOUNTER — Encounter (HOSPITAL_COMMUNITY): Payer: Self-pay

## 2015-11-04 ENCOUNTER — Other Ambulatory Visit (HOSPITAL_COMMUNITY): Payer: Self-pay | Admitting: Maternal and Fetal Medicine

## 2015-11-04 ENCOUNTER — Ambulatory Visit (HOSPITAL_COMMUNITY)
Admission: RE | Admit: 2015-11-04 | Discharge: 2015-11-04 | Disposition: A | Discharge status: Home / Self Care | Payer: Medicaid Other | Source: Ambulatory Visit | Attending: Obstetrics and Gynecology | Admitting: Obstetrics and Gynecology

## 2015-11-04 DIAGNOSIS — O350XX0 Maternal care for (suspected) central nervous system malformation in fetus, not applicable or unspecified: Secondary | ICD-10-CM

## 2015-11-04 DIAGNOSIS — O358XX0 Maternal care for other (suspected) fetal abnormality and damage, not applicable or unspecified: Secondary | ICD-10-CM

## 2015-11-04 DIAGNOSIS — Z3A34 34 weeks gestation of pregnancy: Secondary | ICD-10-CM

## 2015-11-04 DIAGNOSIS — O3509X0 Maternal care for (suspected) other central nervous system malformation or damage in fetus, not applicable or unspecified: Secondary | ICD-10-CM

## 2015-11-04 DIAGNOSIS — O0933 Supervision of pregnancy with insufficient antenatal care, third trimester: Secondary | ICD-10-CM

## 2015-11-04 DIAGNOSIS — O283 Abnormal ultrasonic finding on antenatal screening of mother: Secondary | ICD-10-CM | POA: Diagnosis not present

## 2015-11-04 DIAGNOSIS — O35EXX0 Maternal care for other (suspected) fetal abnormality and damage, fetal genitourinary anomalies, not applicable or unspecified: Secondary | ICD-10-CM

## 2015-11-19 ENCOUNTER — Encounter (HOSPITAL_COMMUNITY): Payer: Self-pay

## 2015-11-19 ENCOUNTER — Inpatient Hospital Stay (HOSPITAL_COMMUNITY): Payer: Medicaid Other | Admitting: Anesthesiology

## 2015-11-19 ENCOUNTER — Inpatient Hospital Stay (HOSPITAL_COMMUNITY)
Admission: AD | Admit: 2015-11-19 | Discharge: 2015-11-21 | DRG: 775 | Disposition: A | Discharge status: Home / Self Care | Payer: Medicaid Other | Source: Ambulatory Visit | Attending: Obstetrics and Gynecology | Admitting: Obstetrics and Gynecology

## 2015-11-19 DIAGNOSIS — O99214 Obesity complicating childbirth: Secondary | ICD-10-CM | POA: Diagnosis present

## 2015-11-19 DIAGNOSIS — O42913 Preterm premature rupture of membranes, unspecified as to length of time between rupture and onset of labor, third trimester: Secondary | ICD-10-CM | POA: Diagnosis present

## 2015-11-19 DIAGNOSIS — E669 Obesity, unspecified: Secondary | ICD-10-CM | POA: Diagnosis present

## 2015-11-19 DIAGNOSIS — D649 Anemia, unspecified: Secondary | ICD-10-CM | POA: Diagnosis present

## 2015-11-19 DIAGNOSIS — Z3A36 36 weeks gestation of pregnancy: Secondary | ICD-10-CM | POA: Diagnosis not present

## 2015-11-19 DIAGNOSIS — O9962 Diseases of the digestive system complicating childbirth: Secondary | ICD-10-CM | POA: Diagnosis present

## 2015-11-19 DIAGNOSIS — O42013 Preterm premature rupture of membranes, onset of labor within 24 hours of rupture, third trimester: Principal | ICD-10-CM | POA: Diagnosis present

## 2015-11-19 DIAGNOSIS — Z87891 Personal history of nicotine dependence: Secondary | ICD-10-CM | POA: Diagnosis not present

## 2015-11-19 DIAGNOSIS — O4290 Premature rupture of membranes, unspecified as to length of time between rupture and onset of labor, unspecified weeks of gestation: Secondary | ICD-10-CM | POA: Diagnosis present

## 2015-11-19 DIAGNOSIS — Z6832 Body mass index (BMI) 32.0-32.9, adult: Secondary | ICD-10-CM

## 2015-11-19 DIAGNOSIS — K219 Gastro-esophageal reflux disease without esophagitis: Secondary | ICD-10-CM | POA: Diagnosis present

## 2015-11-19 DIAGNOSIS — Z833 Family history of diabetes mellitus: Secondary | ICD-10-CM | POA: Diagnosis not present

## 2015-11-19 DIAGNOSIS — O9902 Anemia complicating childbirth: Secondary | ICD-10-CM | POA: Diagnosis present

## 2015-11-19 LAB — CBC
HCT: 33.5 % — ABNORMAL LOW (ref 36.0–46.0)
Hemoglobin: 11.5 g/dL — ABNORMAL LOW (ref 12.0–15.0)
MCH: 29.7 pg (ref 26.0–34.0)
MCHC: 34.3 g/dL (ref 30.0–36.0)
MCV: 86.6 fL (ref 78.0–100.0)
PLATELETS: 180 10*3/uL (ref 150–400)
RBC: 3.87 MIL/uL (ref 3.87–5.11)
RDW: 14.2 % (ref 11.5–15.5)
WBC: 13.8 10*3/uL — ABNORMAL HIGH (ref 4.0–10.5)

## 2015-11-19 LAB — TYPE AND SCREEN
ABO/RH(D): O POS
Antibody Screen: NEGATIVE

## 2015-11-19 LAB — ABO/RH: ABO/RH(D): O POS

## 2015-11-19 LAB — GROUP B STREP BY PCR: Group B strep by PCR: NEGATIVE

## 2015-11-19 LAB — OB RESULTS CONSOLE GBS: GBS: NEGATIVE

## 2015-11-19 MED ORDER — OXYTOCIN 10 UNIT/ML IJ SOLN
1.0000 m[IU]/min | INTRAVENOUS | Status: DC
  Administered 2015-11-19: 2 m[IU]/min via INTRAVENOUS
  Filled 2015-11-19: qty 10

## 2015-11-19 MED ORDER — OXYCODONE-ACETAMINOPHEN 5-325 MG PO TABS: 2.0000 | ORAL_TABLET | ORAL | Status: DC | PRN

## 2015-11-19 MED ORDER — PHENYLEPHRINE 40 MCG/ML (10ML) SYRINGE FOR IV PUSH (FOR BLOOD PRESSURE SUPPORT)
80.0000 ug | PREFILLED_SYRINGE | INTRAVENOUS | Status: DC | PRN
  Filled 2015-11-19: qty 20
  Filled 2015-11-19: qty 2

## 2015-11-19 MED ORDER — CITRIC ACID-SODIUM CITRATE 334-500 MG/5ML PO SOLN: 30.0000 mL | ORAL | Status: DC | PRN

## 2015-11-19 MED ORDER — OXYTOCIN BOLUS FROM INFUSION: 500.0000 mL | INTRAVENOUS | Status: DC

## 2015-11-19 MED ORDER — EPHEDRINE 5 MG/ML INJ
10.0000 mg | INTRAVENOUS | Status: DC | PRN
  Filled 2015-11-19: qty 2

## 2015-11-19 MED ORDER — SODIUM CHLORIDE 0.9 % IV SOLN
2.0000 g | Freq: Once | INTRAVENOUS | Status: AC
  Administered 2015-11-19: 2 g via INTRAVENOUS
  Filled 2015-11-19: qty 2000

## 2015-11-19 MED ORDER — TERBUTALINE SULFATE 1 MG/ML IJ SOLN
0.2500 mg | Freq: Once | INTRAMUSCULAR | Status: DC | PRN
  Filled 2015-11-19: qty 1

## 2015-11-19 MED ORDER — SODIUM CHLORIDE 0.9% FLUSH: 3.0000 mL | INTRAVENOUS | Status: DC | PRN

## 2015-11-19 MED ORDER — LIDOCAINE HCL (PF) 1 % IJ SOLN
INTRAMUSCULAR | Status: DC | PRN
  Administered 2015-11-19 (×2): 4 mL via EPIDURAL

## 2015-11-19 MED ORDER — SODIUM CHLORIDE 0.9 % IV SOLN: 250.0000 mL | INTRAVENOUS | Status: DC | PRN

## 2015-11-19 MED ORDER — LIDOCAINE HCL (PF) 1 % IJ SOLN
30.0000 mL | INTRAMUSCULAR | Status: DC | PRN
  Filled 2015-11-19: qty 30

## 2015-11-19 MED ORDER — LACTATED RINGERS IV SOLN: 500.0000 mL | Freq: Once | INTRAVENOUS | Status: DC

## 2015-11-19 MED ORDER — DIPHENHYDRAMINE HCL 50 MG/ML IJ SOLN: 12.5000 mg | INTRAMUSCULAR | Status: DC | PRN

## 2015-11-19 MED ORDER — OXYCODONE-ACETAMINOPHEN 5-325 MG PO TABS: 1.0000 | ORAL_TABLET | ORAL | Status: DC | PRN

## 2015-11-19 MED ORDER — SODIUM CHLORIDE 0.9% FLUSH: 3.0000 mL | Freq: Two times a day (BID) | INTRAVENOUS | Status: DC

## 2015-11-19 MED ORDER — ACETAMINOPHEN 325 MG PO TABS: 650.0000 mg | ORAL_TABLET | ORAL | Status: DC | PRN

## 2015-11-19 MED ORDER — LACTATED RINGERS IV SOLN: 500.0000 mL | INTRAVENOUS | Status: DC | PRN

## 2015-11-19 MED ORDER — OXYTOCIN 10 UNIT/ML IJ SOLN
2.5000 [IU]/h | INTRAVENOUS | Status: DC
  Administered 2015-11-20: 2.5 [IU]/h via INTRAVENOUS

## 2015-11-19 MED ORDER — ONDANSETRON HCL 4 MG/2ML IJ SOLN: 4.0000 mg | Freq: Four times a day (QID) | INTRAMUSCULAR | Status: DC | PRN

## 2015-11-19 MED ORDER — PHENYLEPHRINE 40 MCG/ML (10ML) SYRINGE FOR IV PUSH (FOR BLOOD PRESSURE SUPPORT)
80.0000 ug | PREFILLED_SYRINGE | INTRAVENOUS | Status: DC | PRN
Start: 2015-11-19 — End: 2015-11-20
  Filled 2015-11-19: qty 2

## 2015-11-19 MED ORDER — LACTATED RINGERS IV SOLN
INTRAVENOUS | Status: DC
Start: 2015-11-19 — End: 2015-11-20

## 2015-11-19 MED ORDER — FENTANYL 2.5 MCG/ML BUPIVACAINE 1/10 % EPIDURAL INFUSION (WH - ANES)
14.0000 mL/h | INTRAMUSCULAR | Status: DC | PRN
  Administered 2015-11-19 (×2): 14 mL/h via EPIDURAL
  Filled 2015-11-19: qty 125

## 2015-11-19 NOTE — Progress Notes (Signed)
Subjective: Doing well S/p epidural states she can "still feel them" but a lot more comfortable.  Feeling more pressure.  States she has a history of fast labors with limited pushing.   Objective: BP 118/63 mmHg  Pulse 76  Temp(Src) 98.4 F (36.9 C) (Oral)  Resp 16  Ht 5' 3.75" (1.619 m)  Wt 84.369 kg (186 lb)  BMI 32.19 kg/m2  SpO2 97%  LMP 03/07/2015      FHT: Category 1, 120, moderate variability, +accels, no decels UC:   regular, every 2-5 minutes SVE:   Dilation: 8 Effacement (%): 90 Station: 0, +1 Exam by:: Allison Nielsen, CNM Membranes: PROM at 1130am Pain management:  IV pain management: none Epidural placement at 22/49 Augmentation: Pitocin at 8 mu/min  Assessment:  IUP at 36.5 weeks NICHD: Category 1 PROM x 12 hrs, no s/s of infection Epidural Augmentation with Pitocin GBS negative  Plan: Anticipate SVD   Allison Nielsen CNM, MN 11/19/2015, 11:20 PM

## 2015-11-19 NOTE — Consults (Signed)
  Anesthesia Pain Consult Note  Patient: Allison Nielsen, 30 y.o., female  Consult Requested by: Osborn CohoAngela Roberts, MD  Reason for Consult: CRNA pain rounds  Level of Consciousness: alert  Pain: current pain 0, pain goal 8, desires epidural. History of quick labors      [facility] Center[facility]GER,Allison Nielsen 11/19/2015

## 2015-11-19 NOTE — Progress Notes (Signed)
Subjective: Assumed care of patient and in to greet patient and husband.  States she is doing well.  Feels contractions once every 20-30 min.    Objective: BP 113/58 mmHg  Pulse 78  Temp(Src) 98.1 F (36.7 C) (Oral)  Resp 16  Ht 5' 3.75" (1.619 m)  Wt 84.369 kg (186 lb)  BMI 32.19 kg/m2  LMP 03/07/2015      FHT: Category 1, 130 bpm, moderate variability, +accels, no decels UC:   irregular, every 2-10 minutes SVE:   Dilation: 2 Effacement (%): 70 Station: -2 Exam by:: R. Makaylen Thieme, CNM Membranes: PROM at 1130am Pain management:  IV pain management: n/a Epidural placement: n/a   Assessment:  IUP at 36.5 weeks NICHD: Category 1 PROM x 8.5, no s/s of infection GBS negative  Plan: Discussed R/B of augmentation with pitocin. Pt agreeable to proceed Begin pitocin 2x2 and increasde per protocol Continuous monitoring Frequent position changes to facilitate fetal rotation and descent.    Alphonzo Severanceachel Emeterio Balke CNM, MN 11/19/2015, 8:19 PM

## 2015-11-19 NOTE — Anesthesia Procedure Notes (Signed)
Epidural Patient location during procedure: OB Start time: 11/19/2015 10:44 PM  Staffing Anesthesiologist: Mal AmabileFOSTER, Eswin Worrell Performed by: anesthesiologist   Preanesthetic Checklist Completed: patient identified, site marked, surgical consent, pre-op evaluation, timeout performed, IV checked, risks and benefits discussed and monitors and equipment checked  Epidural Patient position: sitting Prep: site prepped and draped and DuraPrep Patient monitoring: continuous pulse ox and blood pressure Approach: midline Location: L3-L4 Injection technique: LOR air  Needle:  Needle type: Tuohy  Needle gauge: 17 G Needle length: 9 cm and 9 Needle insertion depth: 5 cm cm Catheter type: closed end flexible Catheter size: 19 Gauge Catheter at skin depth: 10 cm Test dose: negative and Other  Assessment Events: blood not aspirated, injection not painful, no injection resistance, negative IV test and no paresthesia  Additional Notes Patient identified. Risks and benefits discussed including failed block, incomplete  Pain control, post dural puncture headache, nerve damage, paralysis, blood pressure Changes, nausea, vomiting, reactions to medications-both toxic and allergic and post Partum back pain. All questions were answered. Patient expressed understanding and wished to proceed. Sterile technique was used throughout procedure. Epidural site was Dressed with sterile barrier dressing. No paresthesias, signs of intravascular injection Or signs of intrathecal spread were encountered.  Patient was more comfortable after the epidural was dosed. Please see RN's note for documentation of vital signs and FHR which are stable.

## 2015-11-19 NOTE — H&P (Signed)
Allison Nielsen is a 30 y.o. female, G5 P3013 at 36.5 weeks, admitted for PROM at 1130 today clear.    Patient Active Problem [place]   Diagnosis Date Noted  . NSVD (normal spontaneous vaginal delivery) 07/15/2013  . First-degree perineal laceration, with delivery 07/15/2013  . Anemia 07/15/2013  . Uterine size date discrepancy 07/15/2013  . Maternal care for poor fetal growth 07/15/2013  . Fetal growth restriction 07/15/2013  . Late prenatal care 07/15/2013    Pregnancy Course: Patient entered care at 29.0 weeks.   EDC of 12/12/15 was established by LMP.      US evaluations:  29.6 weeks - Anatomy:EFW 3lb 6oz -48.4%, AFI  15.01, cervuical length 4.4, FHR 135, vertex, anterior placenta, no previa, pyelectasis    31.4 weeks - FU: AFI 16.39, FHR 129, vertex, anterior placenta, no previa, mild left sided urinary tract dilation.  Renal pelvis 7.646mm, no calyceal dilation.  No evidence of cerebral ventriculomegaly, fetal growth 75% 34.4 weeks - FU: MFM U/S. Indication: Pyelectasis of fetus on prior U/S. Cerebral ventriculomegaly. [facility]. Single IUP @ 4636w4d. Normal Interview growth(67th %tile). The cerebral ventricles appear wnl's(resolved ventriculomegaly). The remainder of the cranial anatomy appears normal. The previously noted urinary tract dilation has resolved. Kidneys appear wnl bilaterally. Normal AFI.     Significant prenatal events:   Late to care   Last evaluation:   11/18/15 weeks   V  Reason for admission:  PROM  Pt States:   Contractions Frequency: none         Contraction severity: n/a         Fetal activity: +FM  OB History    Gravida Para Term Preterm AB TAB SAB Ectopic Multiple Living   5 3 3  0 1 0 1 0 0 3     Past Medical History  Diagnosis Date  . Medical history non-contributory    Past Surgical History  Procedure Laterality Date  . Tongue surgery     Family History: family history includes Cancer in her maternal grandmother; Diabetes in her  maternal grandfather. Social History:  reports that she has quit smoking. She does not have any smokeless tobacco history on file. She reports that she does not drink alcohol or use illicit drugs.   Prenatal Transfer Tool  Maternal Diabetes: No Genetic Screening: Normal Maternal Ultrasounds/Referrals: Abnormal:  Findings:   Fetal Kidney Anomalies Fetal Ultrasounds or other Referrals:  None Maternal Substance Abuse:  No Significant Maternal Medications:  None Significant Maternal Lab Results: None   ROS:  See HPI above, all other systems are negative  Allergies  Allergen Reactions  . Demerol [Meperidine] Nausea And Vomiting    Pt states it makes her"throw up and makes her really sick"      Blood pressure 126/73, pulse 88, temperature 98.5 F (36.9 C), temperature source Oral, resp. rate 16, weight 186 lb (84.369 kg), last menstrual period 03/07/2015, unknown if currently breastfeeding.  Maternal Exam:  Uterine Assessment: Contraction frequency is rare.  Abdomen: Gravid, non tender. Fundal height is aga.  Normal external genitalia, vulva, cervix, uterus and adnexa.  No lesions noted on exam.  Pelvis adequate for delivery.  Fetal presentation: Vertex by VE  Fetal Exam:  Monitor Surveillance Intermitting Monitoring  Mode: Ultrasound.  NICHD: Category 1 CTXs: Q 3-798minutes EFW   7 lbs  Physical Exam: Nursing note and vitals reviewed General: alert and cooperative She appears well nourished Psychiatric: Normal mood and affect. Her behavior is normal Head: Normocephalic  Eyes: Pupils are equal, round, and reactive to light Neck: Normal range of motion Cardiovascular: RRR without murmur  Respiratory: CTAB. Effort normal  Abd: soft, non-tender, +BS, no rebound, no guarding  Genitourinary: Vagina normal  Neurological: A&Ox3 Skin: Warm and dry  Musculoskeletal: Normal range of motion  Homan's sign negative bilaterally No evidence of DVTs.  Edema: minimal bilaterally  non-pitting edema DTR: 2+ Clonus: None   Prenatal labs: ABO, Rh:  O positive Antibody:  neg Rubella:  immune RPR:   NR HBsAg:   neg HIV:   NR GBS:  negative Sickle cell/Hgb electrophoresis:  WNL Pap:  wnl 09/29/15 GC: negative    Chlamydia: negative Genetic screenings:    Glucola:  wnl   Assessment:  IUP at 36.5 weeks NICHD: Category Membranes: PROM x 3hrs Bishop Score: 4 GBS negative  Plan:  Admit to L&D for expectant/active management of labor. Possible augmentation options reviewed including AROM and/or pitocin.   GBS prophylaxis with PCN G, discontinue if test result are negative  IV pain medication per orders PRN Epidural per patient request Foley cath after patient is comfortable with epidural Anticipate SVD  Labor mgmt as ordered  Okay to ambulate around unit with wireless monitors  Okay to get up and shower without monitoring   May auscultate FHR intermittently,  if expectant management     q 30 min in active labor - x 5 minutes     q 15 min in transition - before during and after a ctx     q 5 min with pushing - before during and after a ctx.     May ambulate without monitoring.     If no active labor, may do NST q 2 hours.   Attending MD available at all times.    Lynde Ludwig, CNM, MSN 11/19/2015, 1:49 PM

## 2015-11-19 NOTE — Progress Notes (Signed)
Labor Progress  Subjective: No complaints  Objective: BP 115/63 mmHg  Pulse 78  Temp(Src) 98.1 F (36.7 C) (Oral)  Resp 16  Ht 5' 3.75" (1.619 m)  Wt 186 lb (84.369 kg)  BMI 32.19 kg/m2  LMP 03/07/2015     FHT: 130, moderate variability, +accel, no decel CTX:  irregular Uterus gravid, soft non tender SVE:   2/70/+2  Assessment:  IUP at 36.5 weeks NICHD: Category 1 Membranes:  PROM x 8hrs, no s/s of infection  Pain management:  IV pain management: n/a Epidural placement: n/a GBS negative  Plan: Continue labor plan intermittent monitoring Frequent position changes to facilitate fetal rotation and descent.       Geeta Dworkin, CNM, MSN 11/19/2015. 6:25 PM

## 2015-11-19 NOTE — MAU Note (Signed)
Urine sent to lab 

## 2015-11-19 NOTE — MAU Note (Signed)
Started gushing fluid, hasn't really stopped. Pinkish in color. Cramping in low back

## 2015-11-20 ENCOUNTER — Encounter (HOSPITAL_COMMUNITY): Payer: Self-pay

## 2015-11-20 LAB — CBC
HEMATOCRIT: 29.8 % — AB (ref 36.0–46.0)
Hemoglobin: 10 g/dL — ABNORMAL LOW (ref 12.0–15.0)
MCH: 29.3 pg (ref 26.0–34.0)
MCHC: 33.6 g/dL (ref 30.0–36.0)
MCV: 87.4 fL (ref 78.0–100.0)
Platelets: 165 10*3/uL (ref 150–400)
RBC: 3.41 MIL/uL — AB (ref 3.87–5.11)
RDW: 14.2 % (ref 11.5–15.5)
WBC: 15.7 10*3/uL — AB (ref 4.0–10.5)

## 2015-11-20 LAB — RPR: RPR: NONREACTIVE

## 2015-11-20 MED ORDER — ACETAMINOPHEN 325 MG PO TABS
650.0000 mg | ORAL_TABLET | ORAL | Status: DC | PRN
  Administered 2015-11-20 (×2): 650 mg via ORAL
  Filled 2015-11-20 (×2): qty 2

## 2015-11-20 MED ORDER — DIBUCAINE 1 % RE OINT: 1.0000 "application " | TOPICAL_OINTMENT | RECTAL | Status: DC | PRN

## 2015-11-20 MED ORDER — OXYCODONE-ACETAMINOPHEN 5-325 MG PO TABS
1.0000 | ORAL_TABLET | ORAL | Status: DC | PRN
  Administered 2015-11-20 – 2015-11-21 (×2): 1 via ORAL
  Filled 2015-11-20 (×2): qty 1

## 2015-11-20 MED ORDER — IBUPROFEN 600 MG PO TABS
600.0000 mg | ORAL_TABLET | Freq: Four times a day (QID) | ORAL | Status: DC
  Administered 2015-11-20 – 2015-11-21 (×6): 600 mg via ORAL
  Filled 2015-11-20 (×6): qty 1

## 2015-11-20 MED ORDER — BENZOCAINE-MENTHOL 20-0.5 % EX AERO
1.0000 "application " | INHALATION_SPRAY | CUTANEOUS | Status: DC | PRN
  Administered 2015-11-20: 1 via TOPICAL
  Filled 2015-11-20: qty 56

## 2015-11-20 MED ORDER — DIPHENHYDRAMINE HCL 25 MG PO CAPS: 25.0000 mg | ORAL_CAPSULE | Freq: Four times a day (QID) | ORAL | Status: DC | PRN

## 2015-11-20 MED ORDER — WITCH HAZEL-GLYCERIN EX PADS: 1.0000 "application " | MEDICATED_PAD | CUTANEOUS | Status: DC | PRN

## 2015-11-20 MED ORDER — DOCUSATE SODIUM 100 MG PO CAPS
100.0000 mg | ORAL_CAPSULE | Freq: Two times a day (BID) | ORAL | Status: DC
  Administered 2015-11-20 – 2015-11-21 (×2): 100 mg via ORAL
  Filled 2015-11-20 (×2): qty 1

## 2015-11-20 MED ORDER — ONDANSETRON HCL 4 MG PO TABS: 4.0000 mg | ORAL_TABLET | ORAL | Status: DC | PRN

## 2015-11-20 MED ORDER — SIMETHICONE 80 MG PO CHEW: 80.0000 mg | CHEWABLE_TABLET | ORAL | Status: DC | PRN

## 2015-11-20 MED ORDER — LANOLIN HYDROUS EX OINT: TOPICAL_OINTMENT | CUTANEOUS | Status: DC | PRN

## 2015-11-20 MED ORDER — PRENATAL MULTIVITAMIN CH
1.0000 | ORAL_TABLET | Freq: Every day | ORAL | Status: DC
  Administered 2015-11-20 – 2015-11-21 (×2): 1 via ORAL
  Filled 2015-11-20 (×2): qty 1

## 2015-11-20 MED ORDER — OXYCODONE-ACETAMINOPHEN 5-325 MG PO TABS: 2.0000 | ORAL_TABLET | ORAL | Status: DC | PRN

## 2015-11-20 MED ORDER — MEDROXYPROGESTERONE ACETATE 150 MG/ML IM SUSP
150.0000 mg | Freq: Once | INTRAMUSCULAR | Status: AC
  Administered 2015-11-21: 150 mg via INTRAMUSCULAR
  Filled 2015-11-20: qty 1

## 2015-11-20 MED ORDER — ONDANSETRON HCL 4 MG/2ML IJ SOLN: 4.0000 mg | INTRAMUSCULAR | Status: DC | PRN

## 2015-11-20 MED ORDER — TETANUS-DIPHTH-ACELL PERTUSSIS 5-2.5-18.5 LF-MCG/0.5 IM SUSP: 0.5000 mL | Freq: Once | INTRAMUSCULAR | Status: DC

## 2015-11-20 MED ORDER — SODIUM BICARBONATE 8.4 % IV SOLN
INTRAVENOUS | Status: DC | PRN
  Administered 2015-11-20: 4 mL via EPIDURAL
  Administered 2015-11-20: 5 mL via EPIDURAL

## 2015-11-20 MED ORDER — ZOLPIDEM TARTRATE 5 MG PO TABS: 5.0000 mg | ORAL_TABLET | Freq: Every evening | ORAL | Status: DC | PRN

## 2015-11-20 NOTE — Anesthesia Preprocedure Evaluation (Signed)
Anesthesia Evaluation  Patient identified by MRN, date of birth, ID band Patient awake    Reviewed: Allergy & Precautions, Patient's Chart, lab work & pertinent test results  Airway Mallampati: III  TM Distance: >3 FB Neck ROM: Full    Dental no notable dental hx. (+) Teeth Intact   Pulmonary neg pulmonary ROS, former smoker,    Pulmonary exam normal breath sounds clear to auscultation       Cardiovascular negative cardio ROS Normal cardiovascular exam Rhythm:Regular Rate:Normal     Neuro/Psych negative neurological ROS     GI/Hepatic Neg liver ROS, GERD  Medicated and Controlled,  Endo/Other  Obesity  Renal/GU negative Renal ROS  negative genitourinary   Musculoskeletal negative musculoskeletal ROS (+)   Abdominal (+) + obese,   Peds  Hematology  (+) anemia ,   Anesthesia Other Findings   Reproductive/Obstetrics (+) Pregnancy                             Anesthesia Physical Anesthesia Plan  ASA: II  Anesthesia Plan: Epidural   Post-op Pain Management:    Induction:   Airway Management Planned: Natural Airway  Additional Equipment:   Intra-op Plan:   Post-operative Plan:   Informed Consent: I have reviewed the patients History and Physical, chart, labs and discussed the procedure including the risks, benefits and alternatives for the proposed anesthesia with the patient or authorized representative who has indicated his/her understanding and acceptance.     Plan Discussed with: Anesthesiologist  Anesthesia Plan Comments:         Anesthesia Quick Evaluation

## 2015-11-20 NOTE — Anesthesia Postprocedure Evaluation (Signed)
Anesthesia Post Note  Patient: Allison Nielsen  Procedure(s) Performed: * No procedures listed *  Patient location during evaluation: Mother Baby Anesthesia Type: Epidural Level of consciousness: awake, awake and alert, oriented and patient cooperative Pain management: pain level controlled Vital Signs Assessment: post-procedure vital signs reviewed and stable Respiratory status: spontaneous breathing, nonlabored ventilation and respiratory function stable Cardiovascular status: stable Postop Assessment: no headache, no backache, patient able to bend at knees and no signs of nausea or vomiting Anesthetic complications: no    Last Vitals:  Filed Vitals:   11/20/15 0340 11/20/15 0818  BP: 101/49 106/63  Pulse: 85 71  Temp: 36.8 C 36.8 C  Resp: 18 18    Last Pain:  Filed Vitals:   11/20/15 0824  PainSc: 0-No pain                 Wilbert Schouten L

## 2015-11-20 NOTE — Progress Notes (Signed)
UR chart review completed.  

## 2015-11-20 NOTE — Progress Notes (Signed)
Subjective: Postpartum Day 0: Vaginal delivery,  Superficial perineal laceration Patient up ad lib, reports no syncope or dizziness. Feeding:  Breast Contraceptive plan:  Depo  Desires d/c tomorrow, due to flu visitation restrictions.  Objective: Vital signs in last 24 hours: Temp:  [98.1 F (36.7 C)-98.9 F (37.2 C)] 98.3 F (36.8 C) (03/16 0818) Pulse Rate:  [71-225] 71 (03/16 0818) Resp:  [14-18] 18 (03/16 0818) BP: (91-133)/(43-78) 106/63 mmHg (03/16 0818) SpO2:  [97 %] 97 % (03/15 2301) Weight:  [84.369 kg (186 lb)] 84.369 kg (186 lb) (03/15 1415)  Physical Exam:  General: alert Lochia: appropriate Uterine Fundus: firm Perineum: healing well DVT Evaluation: No evidence of DVT seen on physical exam. Negative Homan's sign.   CBC Latest Ref Rng 11/20/2015 11/19/2015 07/15/2013  WBC 4.0 - 10.5 K/uL 15.7(H) 13.8(H) 11.3(H)  Hemoglobin 12.0 - 15.0 g/dL 10.0(L) 11.5(L) 9.8(L)  Hematocrit 36.0 - 46.0 % 29.8(L) 33.5(L) 28.8(L)  Platelets 150 - 400 K/uL 165 180 155     Assessment/Plan: Status post vaginal delivery day 0. Stable Continue current care. Plan for discharge tomorrow  Depo Provera 150 mg IM tomorrow.    Regis Hinton, VICKICNM 11/20/2015, 10:03 AM

## 2015-11-20 NOTE — Lactation Note (Signed)
This note was copied from a baby's chart. Lactation Consultation Note:  Lactation Brochure given with Late preterm parent instruction sheet.  Infant is 36. [redacted] weeks gestation.  Discussed need for additional calories for LPT infants.  Mother states that 2 of her children had lip and tongue ties as well as herself. Mother states that she tolerates breastfeeding for 6 weeks and then pumps her breast for 2-3  Months until milk drys up. Mother states that in the early days breastfeeding causes lots of nipple pain.  Assist mother with latching infant on the (R) breast. Infant sustained latch or 10 mins. Mother described pain scale of #8 then eases to a #4. Mother took infant off the breast due to pain after about 10 mins. observes that nipple was compressed flat.  Mother was fit with a #20 nipple shield. Mother request that infant be given supplement at this feeding. Infant was given 7 ml of formula through the nipple shield. Mother has supplemental guidelines. She plans to supplement infant after each breastfeeding. Mother was sat up with a DEBP by staff nurse and plans to pump after each feeding . Mother has pumped a few drops of colostrum. [facility].     Patient Name: Allison RingerBoy Cenia Plagge NUUVO'ZToday's Date: 11/20/2015 Reason for consult: Follow-up assessment   Maternal Data    Feeding Feeding Type: Formula Length of feed: 10 min  LATCH Score/Interventions Latch: Grasps breast easily, tongue down, lips flanged, rhythmical sucking.  Audible Swallowing: Spontaneous and intermittent  Type of Nipple: Everted at rest and after stimulation  Comfort (Breast/Nipple): Filling, red/small blisters or bruises, mild/mod discomfort  Problem noted: Mild/Moderate discomfort Interventions (Mild/moderate discomfort): Hand expression;Comfort gels  Hold (Positioning): Assistance needed to correctly position infant at breast and maintain latch. Intervention(s): Support Pillows;Position  options;Skin to skin  LATCH Score: 8  Lactation Tools Discussed/Used Tools: Nipple Shields Nipple shield size: 20   Consult Status Consult Status: Follow-up Date: 11/20/15 Follow-up type: In-patient    Allison Nielsen, Allison Nielsen [facility] 11/20/2015, 4:28 PM

## 2015-11-21 MED ORDER — IBUPROFEN 600 MG PO TABS: 600.0000 mg | ORAL_TABLET | Freq: Four times a day (QID) | ORAL | Status: AC

## 2015-11-21 MED ORDER — FERROUS SULFATE 325 (65 FE) MG PO TABS: 325.0000 mg | ORAL_TABLET | Freq: Two times a day (BID) | ORAL | Status: AC

## 2015-11-21 MED ORDER — FERROUS SULFATE 325 (65 FE) MG PO TABS: 325.0000 mg | ORAL_TABLET | Freq: Two times a day (BID) | ORAL | Status: DC

## 2015-11-21 NOTE — Discharge Summary (Signed)
OB Discharge Summary  Patient Name: Allison Nielsen DOB: 15-Aug-1986 MRN: [id number]  Date of admission: 11/19/2015 Delivering MD: Alphonzo Severance   Date of discharge: 11/21/2015  Admitting diagnosis: GUSHING FLUID,DECREASED FM Intrauterine pregnancy: [redacted]w[redacted]d     Secondary diagnosis:Principal Problem:   Spontaneous vaginal delivery Active Problems:   PROM (premature rupture of membranes)  Additional problems:anemia     Discharge diagnosis: Term Pregnancy Delivered and Anemia                                                                     Post partum procedures:none  Augmentation: Pitocin  Complications: None  Hospital course:  Onset of Labor With Vaginal Delivery     30 y.o. yo [id number] at [redacted]w[redacted]d was admitted in Latent Labor on 11/19/2015. Patient had an uncomplicated labor course as follows:  Membrane Rupture Time/Date: 11:30 AM ,11/19/2015   Intrapartum Procedures: Episiotomy: None [1]                                         Lacerations:  Perineal [11]  Patient had a delivery of a Viable infant. 11/20/2015  Information for the patient's newborn:  Allison Nielsen, Allison Nielsen [[id number]]  Delivery Method: Vaginal, Spontaneous Delivery (Filed from Delivery Summary)    Pateint had an uncomplicated postpartum course.  She is ambulating, tolerating a regular diet, passing flatus, and urinating well. Patient is discharged home in stable condition on 11/21/2015.    Physical exam  Filed Vitals:   11/20/15 0340 11/20/15 0818 11/20/15 1900 11/21/15 0637  BP: 101/49 106/63 101/58 105/59  Pulse: 85 71 69 70  Temp: 98.3 F (36.8 C) 98.3 F (36.8 C) 98.4 F (36.9 C) 98 F (36.7 C)  TempSrc: Oral Oral Oral Oral  Resp: 18 18 20 18   Height:      Weight:      SpO2:   96%    General: alert, cooperative and no distress Lochia: appropriate Uterine Fundus: firm Incision: Healing well with no significant drainage DVT Evaluation: No evidence of DVT seen on physical exam. Labs: Lab Results    Component Value Date   WBC 15.7* 11/20/2015   HGB 10.0* 11/20/2015   HCT 29.8* 11/20/2015   MCV 87.4 11/20/2015   PLT 165 11/20/2015   No flowsheet data found.  Discharge instruction: per After Visit Summary and "Baby and Me Booklet".  Medications:  Current facility-administered medications:  .  acetaminophen (TYLENOL) tablet 650 mg, 650 mg, Oral, Q4H PRN, Alphonzo Severance, CNM, 650 mg at 11/20/15 2200 .  benzocaine-Menthol (DERMOPLAST) 20-0.5 % topical spray 1 application, 1 application, Topical, PRN, Alphonzo Severance, CNM, 1 application at 11/20/15 0349 .  witch hazel-glycerin (TUCKS) pad 1 application, 1 application, Topical, PRN **AND** dibucaine (NUPERCAINAL) 1 % rectal ointment 1 application, 1 application, Rectal, PRN, Alphonzo Severance, CNM .  diphenhydrAMINE (BENADRYL) capsule 25 mg, 25 mg, Oral, Q6H PRN, Alphonzo Severance, CNM .  docusate sodium (COLACE) capsule 100 mg, 100 mg, Oral, BID, Alphonzo Severance, CNM, 100 mg at 11/20/15 2200 .  ferrous sulfate tablet 325 mg, 325 mg, Oral, BID WC, Sailor Haughn, CNM .  ibuprofen (ADVIL,MOTRIN) tablet  600 mg, 600 mg, Oral, 4 times per day, Alphonzo Severanceachel Stall, CNM, 600 mg at 11/21/15 [id number] .  lanolin ointment, , Topical, PRN, Alphonzo Severanceachel Stall, CNM .  medroxyPROGESTERone (DEPO-PROVERA) injection 150 mg, 150 mg, Intramuscular, Once, Nigel BridgemanVicki Latham, CNM .  ondansetron (ZOFRAN) tablet 4 mg, 4 mg, Oral, Q4H PRN **OR** ondansetron (ZOFRAN) injection 4 mg, 4 mg, Intravenous, Q4H PRN, Alphonzo Severanceachel Stall, CNM .  oxyCODONE-acetaminophen (PERCOCET/ROXICET) 5-325 MG per tablet 1 tablet, 1 tablet, Oral, Q4H PRN, Alphonzo Severanceachel Stall, CNM, 1 tablet at 11/21/15 0201 .  oxyCODONE-acetaminophen (PERCOCET/ROXICET) 5-325 MG per tablet 2 tablet, 2 tablet, Oral, Q4H PRN, Alphonzo Severanceachel Stall, CNM .  prenatal multivitamin tablet 1 tablet, 1 tablet, Oral, Q1200, Alphonzo Severanceachel Stall, CNM, 1 tablet at 11/20/15 1128 .  simethicone (MYLICON) chewable tablet 80 mg, 80 mg, Oral, PRN, Alphonzo Severanceachel Stall, CNM .  Tdap (BOOSTRIX)  injection 0.5 mL, 0.5 mL, Intramuscular, Once, Alphonzo Severanceachel Stall, CNM .  zolpidem (AMBIEN) tablet 5 mg, 5 mg, Oral, QHS PRN, Alphonzo Severanceachel Stall, CNM After Visit Meds:    Medication Christmas    ASK your doctor about these medications        acetaminophen 500 MG tablet  Commonly known as:  TYLENOL  Take 1,000 mg by mouth every 6 (six) hours as needed for mild pain, moderate pain or headache.     calcium carbonate 500 MG chewable tablet  Commonly known as:  TUMS - dosed in mg elemental calcium  Chew 2 tablets by mouth daily as needed for indigestion or heartburn.     prenatal multivitamin Tabs tablet  Take 1 tablet by mouth daily at 12 noon.        Diet: routine diet  Activity: Advance as tolerated. Pelvic rest for 6 weeks.   Outpatient follow up:6 weeks Follow up Appt:No future appointments. Follow up visit: No Follow-up on file.  Postpartum contraception: Depo Provera  Newborn Data: Live born female  Birth Weight: 6 lb 8.8 oz (2971 g) APGAR: 9, 9  Baby Feeding: Breast Disposition:home with mother   11/21/2015 Kambrea Carrasco, CNM      Postpartum Care After Vaginal Delivery  After you deliver your newborn (postpartum period), the usual stay in the hospital is 24 72 hours. If there were problems with your labor or delivery, or if you have other medical problems, you might be in the hospital longer.  While you are in the hospital, you will receive help and instructions on how to care for yourself and your newborn during the postpartum period.  While you are in the hospital:  Be sure to tell your nurses if you have pain or discomfort, as well as where you feel the pain and what makes the pain worse.  If you had an incision made near your vagina (episiotomy) or if you had some tearing during delivery, the nurses may put ice packs on your episiotomy or tear. The ice packs may help to reduce the pain and swelling.  If you are breastfeeding, you may feel uncomfortable contractions of  your uterus for a couple of weeks. This is normal. The contractions help your uterus get back to normal size.  It is normal to have some bleeding after delivery.  For the first 1 3 days after delivery, the flow is red and the amount may be similar to a period.  It is common for the flow to start and stop.  In the first few days, you may pass some small clots. Let your nurses know if you begin to pass  large clots or your flow increases.  Do not  flush blood clots down the toilet before having the nurse look at them.  During the next 3 10 days after delivery, your flow should become more watery and pink or brown-tinged in color.  Ten to fourteen days after delivery, your flow should be a small amount of yellowish-white discharge.  The amount of your flow will decrease over the first few weeks after delivery. Your flow may stop in 6 8 weeks. Most women have had their flow stop by 12 weeks after delivery.  You should change your sanitary pads frequently.  Wash your hands thoroughly with soap and water for at least 20 seconds after changing pads, using the toilet, or before holding or feeding your newborn.  You should feel like you need to empty your bladder within the first 6 8 hours after delivery.  In case you become weak, lightheaded, or faint, call your nurse before you get out of bed for the first time and before you take a shower for the first time.  Within the first few days after delivery, your breasts may begin to feel tender and full. This is called engorgement. Breast tenderness usually goes away within 48 72 hours after engorgement occurs. You may also notice milk leaking from your breasts. If you are not breastfeeding, do not stimulate your breasts. Breast stimulation can make your breasts produce more milk.  Spending as much time as possible with your newborn is very important. During this time, you and your newborn can feel close and get to know each other. Having your newborn  stay in your room (rooming in) will help to strengthen the bond with your newborn. It will give you time to get to know your newborn and become comfortable caring for your newborn.  Your hormones change after delivery. Sometimes the hormone changes can temporarily cause you to feel sad or tearful. These feelings should not last more than a few days. If these feelings last longer than that, you should talk to your caregiver.  If desired, talk to your caregiver about methods of family planning or contraception.  Talk to your caregiver about immunizations. Your caregiver may want you to have the following immunizations before leaving the hospital:  Tetanus, diphtheria, and pertussis (Tdap) or tetanus and diphtheria (Td) immunization. It is very important that you and your family (including grandparents) or others caring for your newborn are up-to-date with the Tdap or Td immunizations. The Tdap or Td immunization can help protect your newborn from getting ill.  Rubella immunization.  Varicella (chickenpox) immunization.  Influenza immunization. You should receive this annual immunization if you did not receive the immunization during your pregnancy. Document Released: 06/20/2007 Document Revised: 05/17/2012 Document Reviewed: 04/19/2012 [facility] Patient Information 2014 [place], [place].   Postpartum Depression and Baby Blues  The postpartum period begins right after the birth of a baby. During this time, there is often a great amount of joy and excitement. It is also a time of considerable changes in the life of the parent(s). Regardless of how many times a mother gives birth, each child brings new challenges and dynamics to the family. It is not unusual to have feelings of excitement accompanied by confusing shifts in moods, emotions, and thoughts. All mothers are at risk of developing postpartum depression or the "baby blues." These mood changes can occur right after giving birth, or they may  occur many months after giving birth. The baby blues or postpartum depression can be  mild or severe. Additionally, postpartum depression can resolve rather quickly, or it can be a long-term condition. CAUSES Elevated hormones and their rapid decline are thought to be a main cause of postpartum depression and the baby blues. There are a number of hormones that radically change during and after pregnancy. Estrogen and progesterone usually decrease immediately after delivering your baby. The level of thyroid hormone and various cortisol steroids also rapidly drop. Other factors that play a major role in these changes include major life events and genetics.  RISK FACTORS If you have any of the following risks for the baby blues or postpartum depression, know what symptoms to watch out for during the postpartum period. Risk factors that may increase the likelihood of getting the baby blues or postpartum depression include: 1. Havinga personal or family history of depression. 2. Having depression while being pregnant. 3. Having premenstrual or oral contraceptive-associated mood issues. 4. Having exceptional life stress. 5. Having marital conflict. 6. Lacking a social support network. 7. Having a baby with special needs. 8. Having health problems such as diabetes. SYMPTOMS Baby blues symptoms include:  Brief fluctuations in mood, such as going from extreme happiness to sadness.  Decreased concentration.  Difficulty sleeping.  Crying spells, tearfulness.  Irritability.  Anxiety. Postpartum depression symptoms typically begin within the first month after giving birth. These symptoms include:  Difficulty sleeping or excessive sleepiness.  Marked weight loss.  Agitation.  Feelings of worthlessness.  Lack of interest in activity or food. Postpartum psychosis is a very concerning condition and can be dangerous. Fortunately, it is rare. Displaying any of the following symptoms is cause for  immediate medical attention. Postpartum psychosis symptoms include:  Hallucinations and delusions.  Bizarre or disorganized behavior.  Confusion or disorientation. DIAGNOSIS  A diagnosis is made by an evaluation of your symptoms. There are no medical or lab tests that lead to a diagnosis, but there are various questionnaires that a caregiver may use to identify those with the baby blues, postpartum depression, or psychosis. Often times, a screening tool called the [place] Postnatal Depression Scale is used to diagnose depression in the postpartum period.  TREATMENT The baby blues usually goes away on its own in 1 to 2 weeks. Social support is often all that is needed. You should be encouraged to get adequate sleep and rest. Occasionally, you may be given medicines to help you sleep.  Postpartum depression requires treatment as it can last several months or longer if it is not treated. Treatment may include individual or group therapy, medicine, or both to address any social, physiological, and psychological factors that may play a role in the depression. Regular exercise, a healthy diet, rest, and social support may also be strongly recommended.  Postpartum psychosis is more serious and needs treatment right away. Hospitalization is often needed. HOME CARE INSTRUCTIONS  Get as much rest as you can. Nap when the baby sleeps.  Exercise regularly. Some women find yoga and walking to be beneficial.  Eat a balanced and nourishing diet.  Do little things that you enjoy. Have a cup of tea, take a bubble bath, read your favorite magazine, or listen to your favorite music.  Avoid alcohol.  Ask for help with household chores, cooking, grocery shopping, or running errands as needed. Do not try to do everything.  Talk to people close to you about how you are feeling. Get support from your partner, family members, friends, or other new moms.  Try to stay positive in how you  think. Think about the  things you are grateful for.  Do not spend a lot of time alone.  Only take medicine as directed by your caregiver.  Keep all your postpartum appointments.  Let your caregiver know if you have any concerns. SEEK MEDICAL CARE IF: You are having a reaction or problems with your medicine. SEEK IMMEDIATE MEDICAL CARE IF:  You have suicidal feelings.  You feel you may harm the baby or someone else. Document Released: 05/27/2004 Document Revised: 11/15/2011 Document Reviewed: 06/29/2011 [facility] Patient Information 2014 [place], [place].     Breastfeeding Deciding to breastfeed is one of the best choices you can make for you and your baby. A change in hormones during pregnancy causes your breast tissue to grow and increases the number and size of your milk ducts. These hormones also allow proteins, sugars, and fats from your blood supply to make breast milk in your milk-producing glands. Hormones prevent breast milk from being released before your baby is born as well as prompt milk flow after birth. Once breastfeeding has begun, thoughts of your baby, as well as his or her sucking or crying, can stimulate the release of milk from your milk-producing glands.  BENEFITS OF BREASTFEEDING For Your Baby  Your first milk (colostrum) helps your baby's digestive system function better.   There are antibodies in your milk that help your baby fight off infections.   Your baby has a lower incidence of asthma, allergies, and sudden infant death syndrome.   The nutrients in breast milk are better for your baby than infant formulas and are designed uniquely for your baby's needs.   Breast milk improves your baby's brain development.   Your baby is less likely to develop other conditions, such as childhood obesity, asthma, or type 2 diabetes mellitus.  For You   Breastfeeding helps to create a very special bond between you and your baby.   Breastfeeding is convenient. Breast milk is always  available at the correct temperature and costs nothing.   Breastfeeding helps to burn calories and helps you lose the weight gained during pregnancy.   Breastfeeding makes your uterus contract to its prepregnancy size faster and slows bleeding (lochia) after you give birth.   Breastfeeding helps to lower your risk of developing type 2 diabetes mellitus, osteoporosis, and breast or ovarian cancer later in life. SIGNS THAT YOUR BABY IS HUNGRY Early Signs of Hunger  Increased alertness or activity.  Stretching.  Movement of the head from side to side.  Movement of the head and opening of the mouth when the corner of the mouth or cheek is stroked (rooting).  Increased sucking sounds, smacking lips, cooing, sighing, or squeaking.  Hand-to-mouth movements.  Increased sucking of fingers or hands. Late Signs of Hunger  Fussing.  Intermittent crying. Extreme Signs of Hunger Signs of extreme hunger will require calming and consoling before your baby will be able to breastfeed successfully. Do not wait for the following signs of extreme hunger to occur before you initiate breastfeeding:   Restlessness.  A loud, strong cry.   Screaming.   BREASTFEEDING BASICS Breastfeeding Initiation  Find a comfortable place to sit or lie down, with your neck and back well supported.  Place a pillow or rolled up blanket under your baby to bring him or her to the level of your breast (if you are seated). Nursing pillows are specially designed to help support your arms and your baby while you breastfeed.  Make sure that your baby's abdomen  is facing your abdomen.   Gently massage your breast. With your fingertips, massage from your chest wall toward your nipple in a circular motion. This encourages milk flow. You may need to continue this action during the feeding if your milk flows slowly.  Support your breast with 4 fingers underneath and your thumb above your nipple. Make sure your  fingers are well away from your nipple and your baby's mouth.   Stroke your baby's lips gently with your finger or nipple.   When your baby's mouth is open wide enough, quickly bring your baby to your breast, placing your entire nipple and as much of the colored area around your nipple (areola) as possible into your baby's mouth.   More areola should be visible above your baby's upper lip than below the lower lip.   Your baby's tongue should be between his or her lower gum and your breast.   Ensure that your baby's mouth is correctly positioned around your nipple (latched). Your baby's lips should create a seal on your breast and be turned out (everted).  It is common for your baby to suck about 2-3 minutes in order to start the flow of breast milk. Latching Teaching your baby how to latch on to your breast properly is very important. An improper latch can cause nipple pain and decreased milk supply for you and poor weight gain in your baby. Also, if your baby is not latched onto your nipple properly, he or she may swallow some air during feeding. This can make your baby fussy. Burping your baby when you switch breasts during the feeding can help to get rid of the air. However, teaching your baby to latch on properly is still the best way to prevent fussiness from swallowing air while breastfeeding. Signs that your baby has successfully latched on to your nipple:    Silent tugging or silent sucking, without causing you pain.   Swallowing heard between every 3-4 sucks.    Muscle movement above and in front of his or her ears while sucking.  Signs that your baby has not successfully latched on to nipple:   Sucking sounds or smacking sounds from your baby while breastfeeding.  Nipple pain. If you think your baby has not latched on correctly, slip your finger into the corner of your baby's mouth to break the suction and place it between your baby's gums. Attempt breastfeeding  initiation again. Signs of Successful Breastfeeding Signs from your baby:   A gradual decrease in the number of sucks or complete cessation of sucking.   Falling asleep.   Relaxation of his or her body.   Retention of a small amount of milk in his or her mouth.   Letting go of your breast by himself or herself. Signs from you:  Breasts that have increased in firmness, weight, and size 1-3 hours after feeding.   Breasts that are softer immediately after breastfeeding.  Increased milk volume, as well as a change in milk consistency and color by the fifth day of breastfeeding.   Nipples that are not sore, cracked, or bleeding. Signs That Your Pecola Leisure is Getting Enough Milk  Wetting at least 3 diapers in a 24-hour period. The urine should be clear and pale yellow by age 24 days.  At least 3 stools in a 24-hour period by age 24 days. The stool should be soft and yellow.  At least 3 stools in a 24-hour period by age 24 days. The stool should be seedy  and yellow.  No loss of weight greater than 10% of birth weight during the first 32 days of age.  Average weight gain of 4-7 ounces (113-198 g) per week after age 8 days.  Consistent daily weight gain by age 79 days, without weight loss after the age of 2 weeks. After a feeding, your baby may spit up a small amount. This is common. BREASTFEEDING FREQUENCY AND DURATION Frequent feeding will help you make more milk and can prevent sore nipples and breast engorgement. Breastfeed when you feel the need to reduce the fullness of your breasts or when your baby shows signs of hunger. This is called "breastfeeding on demand." Avoid introducing a pacifier to your baby while you are working to establish breastfeeding (the first 4-6 weeks after your baby is born). After this time you may choose to use a pacifier. Research has shown that pacifier use during the first year of a baby's life decreases the risk of sudden infant death syndrome  (SIDS). Allow your baby to feed on each breast as long as he or she wants. Breastfeed until your baby is finished feeding. When your baby unlatches or falls asleep while feeding from the first breast, offer the second breast. Because newborns are often sleepy in the first few weeks of life, you may need to awaken your baby to get him or her to feed. Breastfeeding times will vary from baby to baby. However, the following rules can serve as a guide to help you ensure that your baby is properly fed:  Newborns (babies 4 weeks of age or younger) may breastfeed every 1-3 hours.  Newborns should not go longer than 3 hours during the day or 5 hours during the night without breastfeeding.  You should breastfeed your baby a minimum of 8 times in a 24-hour period until you begin to introduce solid foods to your baby at around 62 months of age. BREAST MILK PUMPING Pumping and storing breast milk allows you to ensure that your baby is exclusively fed your breast milk, even at times when you are unable to breastfeed. This is especially important if you are going back to work while you are still breastfeeding or when you are not able to be present during feedings. Your lactation consultant can give you guidelines on how long it is safe to store breast milk.  A breast pump is a machine that allows you to pump milk from your breast into a sterile bottle. The pumped breast milk can then be stored in a refrigerator or freezer. Some breast pumps are operated by hand, while others use electricity. Ask your lactation consultant which type will work best for you. Breast pumps can be purchased, but some hospitals and breastfeeding support groups lease breast pumps on a monthly basis. A lactation consultant can teach you how to hand express breast milk, if you prefer not to use a pump.  CARING FOR YOUR BREASTS WHILE YOU BREASTFEED Nipples can become dry, cracked, and sore while breastfeeding. The following recommendations can  help keep your breasts moisturized and healthy:  Avoid using soap on your nipples.   Wear a supportive bra. Although not required, special nursing bras and tank tops are designed to allow access to your breasts for breastfeeding without taking off your entire bra or top. Avoid wearing underwire-style bras or extremely tight bras.  Air dry your nipples for 3-61minutes after each feeding.   Use only cotton bra pads to absorb leaked breast milk. Leaking of breast milk between  feedings is normal.   Use lanolin on your nipples after breastfeeding. Lanolin helps to maintain your skin's normal moisture barrier. If you use pure lanolin, you do not need to wash it off before feeding your baby again. Pure lanolin is not toxic to your baby. You may also hand express a few drops of breast milk and gently massage that milk into your nipples and allow the milk to air dry. In the first few weeks after giving birth, some women experience extremely full breasts (engorgement). Engorgement can make your breasts feel heavy, warm, and tender to the touch. Engorgement peaks within 3-5 days after you give birth. The following recommendations can help ease engorgement:  Completely empty your breasts while breastfeeding or pumping. You may want to start by applying warm, moist heat (in the shower or with warm water-soaked hand towels) just before feeding or pumping. This increases circulation and helps the milk flow. If your baby does not completely empty your breasts while breastfeeding, pump any extra milk after he or she is finished.  Wear a snug bra (nursing or regular) or tank top for 1-2 days to signal your body to slightly decrease milk production.  Apply ice packs to your breasts, unless this is too uncomfortable for you.  Make sure that your baby is latched on and positioned properly while breastfeeding. If engorgement persists after 48 hours of following these recommendations, contact your health care  provider or a Advertising copywriter. OVERALL HEALTH CARE RECOMMENDATIONS WHILE BREASTFEEDING  Eat healthy foods. Alternate between meals and snacks, eating 3 of each per day. Because what you eat affects your breast milk, some of the foods may make your baby more irritable than usual. Avoid eating these foods if you are sure that they are negatively affecting your baby.  Drink milk, fruit juice, and water to satisfy your thirst (about 10 glasses a day).   Rest often, relax, and continue to take your prenatal vitamins to prevent fatigue, stress, and anemia.  Continue breast self-awareness checks.  Avoid chewing and smoking tobacco.  Avoid alcohol and drug use. Some medicines that may be harmful to your baby can pass through breast milk. It is important to ask your health care provider before taking any medicine, including all over-the-counter and prescription medicine as well as vitamin and herbal supplements. It is possible to become pregnant while breastfeeding. If birth control is desired, ask your health care provider about options that will be safe for your baby. SEEK MEDICAL CARE IF:   You feel like you want to stop breastfeeding or have become frustrated with breastfeeding.  You have painful breasts or nipples.  Your nipples are cracked or bleeding.  Your breasts are red, tender, or warm.  You have a swollen area on either breast.  You have a fever or chills.  You have nausea or vomiting.  You have drainage other than breast milk from your nipples.  Your breasts do not become full before feedings by the fifth day after you give birth.  You feel sad and depressed.  Your baby is too sleepy to eat well.  Your baby is having trouble sleeping.   Your baby is wetting less than 3 diapers in a 24-hour period.  Your baby has less than 3 stools in a 24-hour period.  Your baby's skin or the white part of his or her eyes becomes yellow.   Your baby is not gaining weight by  46 days of age. SEEK IMMEDIATE MEDICAL CARE IF:  Your baby is overly tired (lethargic) and does not want to wake up and feed.  Your baby develops an unexplained fever. Document Released: 08/23/2005 Document Revised: 08/28/2013 Document Reviewed: 02/14/2013 [facility] Patient Information 2015 [place], [place]. This information is not intended to replace advice given to you by your health care provider. Make sure you discuss any questions you have with your health care provider.

## 2015-11-21 NOTE — Progress Notes (Signed)
Allison Nielsen   Subjective: Post Partum Day 1 Vaginal delivery, superficial perneal laceration Patient up ad lib, denies syncope or dizziness. Reports consuming regular diet without issues and denies N/V No issues with urination and reports bleeding is appropriate  Feeding:  breast Contraceptive plan:   depo  Objective: Temp:  [98 F (36.7 C)-98.4 F (36.9 C)] 98 F (36.7 C) (03/17 0637) Pulse Rate:  [69-70] 70 (03/17 0637) Resp:  [18-20] 18 (03/17 0637) BP: (101-105)/(58-59) 105/59 mmHg (03/17 0637) SpO2:  [96 %] 96 % (03/16 1900)  Physical Exam:  General: alert and cooperative Ext: WNL, no significant  edema. No evidence of DVT seen on physical exam. Breast: Soft filling Lungs: CTAB Heart RRR without murmur  Abdomen:  Soft, fundus firm, lochia scant, + bowel sounds, non distended, non tender Lochia: appropriate Uterine Fundus: firm Laceration: healing well    Recent Labs  11/19/15 1415 11/20/15 0504  HGB 11.5* 10.0*  HCT 33.5* 29.8*    Assessment S/P Vaginal Delivery-Day 1 Stable  Normal Involution Breastfeeding Circumcision: out patient  Plan: Continue current care Plan for discharge tomorrow, Breastfeeding and Lactation consult Lactation support Iron supplement  Diedre Maclellan, CNM, MSN 11/21/2015, 10:09 AM

## 2016-02-20 DIAGNOSIS — Y939 Activity, unspecified: Secondary | ICD-10-CM | POA: Diagnosis not present

## 2016-02-20 DIAGNOSIS — Z87891 Personal history of nicotine dependence: Secondary | ICD-10-CM | POA: Insufficient documentation

## 2016-02-20 DIAGNOSIS — Z79899 Other long term (current) drug therapy: Secondary | ICD-10-CM | POA: Insufficient documentation

## 2016-02-20 DIAGNOSIS — Y999 Unspecified external cause status: Secondary | ICD-10-CM | POA: Diagnosis not present

## 2016-02-20 DIAGNOSIS — W01198A Fall on same level from slipping, tripping and stumbling with subsequent striking against other object, initial encounter: Secondary | ICD-10-CM | POA: Diagnosis not present

## 2016-02-20 DIAGNOSIS — Y929 Unspecified place or not applicable: Secondary | ICD-10-CM | POA: Insufficient documentation

## 2016-02-20 DIAGNOSIS — S0181XA Laceration without foreign body of other part of head, initial encounter: Secondary | ICD-10-CM | POA: Diagnosis not present

## 2016-02-21 ENCOUNTER — Emergency Department (HOSPITAL_COMMUNITY)
Admission: EM | Admit: 2016-02-21 | Discharge: 2016-02-21 | Disposition: A | Discharge status: Home / Self Care | Payer: Medicaid Other | Attending: Emergency Medicine | Admitting: Emergency Medicine

## 2016-02-21 ENCOUNTER — Encounter (HOSPITAL_COMMUNITY): Payer: Self-pay | Admitting: Emergency Medicine

## 2016-02-21 DIAGNOSIS — S0181XA Laceration without foreign body of other part of head, initial encounter: Secondary | ICD-10-CM

## 2016-02-21 MED ORDER — IBUPROFEN 800 MG PO TABS
800.0000 mg | ORAL_TABLET | Freq: Once | ORAL | Status: AC
  Administered 2016-02-21: 800 mg via ORAL
  Filled 2016-02-21: qty 1

## 2016-02-21 MED ORDER — LIDOCAINE-EPINEPHRINE (PF) 2 %-1:200000 IJ SOLN
10.0000 mL | Freq: Once | INTRAMUSCULAR | Status: AC
  Administered 2016-02-21: 10 mL
  Filled 2016-02-21: qty 20

## 2016-02-21 NOTE — Discharge Instructions (Signed)
CHECK WITH YOUR DOCTOR ABOUT YOUR MOST RECENT TETANUS SHOT. THIS SHOULD BE UPDATED EVERY 10 YEARS AND, IF OUT OF DATE, SHOULD BE GIVEN AS SOON AS POSSIBLE.  Do not soak your head in water such as with swimming or in a bath. Apply bacitracin and/or Vitamin E to prevent infection and reduce scarring. Take ibuprofen for pain control or headache. Have stitches removed here or at urgent care in 1 week.   Laceration Care, Adult A laceration is a cut that goes through all of the layers of the skin and into the tissue that is right under the skin. Some lacerations heal on their own. Others need to be closed with stitches (sutures), staples, skin adhesive strips, or skin glue. Proper laceration care minimizes the risk of infection and helps the laceration to heal better. HOW TO CARE FOR YOUR LACERATION If sutures or staples were used:  Keep the wound clean and dry.  If you were given a bandage (dressing), you should change it at least one time per day or as told by your health care provider. You should also change it if it becomes wet or dirty.  Keep the wound completely dry for the first 24 hours or as told by your health care provider. After that time, you may shower or bathe. However, make sure that the wound is not soaked in water until after the sutures or staples have been removed.  Clean the wound one time each day or as told by your health care provider:  Wash the wound with soap and water.  Rinse the wound with water to remove all soap.  Pat the wound dry with a clean towel. Do not rub the wound.  After cleaning the wound, apply a thin layer of antibiotic ointmentas told by your health care provider. This will help to prevent infection and keep the dressing from sticking to the wound.  Have the sutures or staples removed as told by your health care provider. If skin adhesive strips were used:  Keep the wound clean and dry.  If you were given a bandage (dressing), you should change it  at least one time per day or as told by your health care provider. You should also change it if it becomes dirty or wet.  Do not get the skin adhesive strips wet. You may shower or bathe, but be careful to keep the wound dry.  If the wound gets wet, pat it dry with a clean towel. Do not rub the wound.  Skin adhesive strips fall off on their own. You may trim the strips as the wound heals. Do not remove skin adhesive strips that are still stuck to the wound. They will fall off in time. If skin glue was used:  Try to keep the wound dry, but you may briefly wet it in the shower or bath. Do not soak the wound in water, such as by swimming.  After you have showered or bathed, gently pat the wound dry with a clean towel. Do not rub the wound.  Do not do any activities that will make you sweat heavily until the skin glue has fallen off on its own.  Do not apply liquid, cream, or ointment medicine to the wound while the skin glue is in place. Using those may loosen the film before the wound has healed.  If you were given a bandage (dressing), you should change it at least one time per day or as told by your health care provider. You  should also change it if it becomes dirty or wet.  If a dressing is placed over the wound, be careful not to apply tape directly over the skin glue. Doing that may cause the glue to be pulled off before the wound has healed.  Do not pick at the glue. The skin glue usually remains in place for 5-10 days, then it falls off of the skin. General Instructions  Take over-the-counter and prescription medicines only as told by your health care provider.  If you were prescribed an antibiotic medicine or ointment, take or apply it as told by your doctor. Do not stop using it even if your condition improves.  To help prevent scarring, make sure to cover your wound with sunscreen whenever you are outside after stitches are removed, after adhesive strips are removed, or when glue  remains in place and the wound is healed. Make sure to wear a sunscreen of at least 30 SPF.  Do not scratch or pick at the wound.  Keep all follow-up visits as told by your health care provider. This is important.  Check your wound every day for signs of infection. Watch for:  Redness, swelling, or pain.  Fluid, blood, or pus.  Raise (elevate) the injured area above the level of your heart while you are sitting or lying down, if possible. SEEK MEDICAL CARE IF:  You received a tetanus shot and you have swelling, severe pain, redness, or bleeding at the injection site.  You have a fever.  A wound that was closed breaks open.  You notice a bad smell coming from your wound or your dressing.  You notice something coming out of the wound, such as wood or glass.  Your pain is not controlled with medicine.  You have increased redness, swelling, or pain at the site of your wound.  You have fluid, blood, or pus coming from your wound.  You notice a change in the color of your skin near your wound.  You need to change the dressing frequently due to fluid, blood, or pus draining from the wound.  You develop a new rash.  You develop numbness around the wound. SEEK IMMEDIATE MEDICAL CARE IF:  You develop severe swelling around the wound.  Your pain suddenly increases and is severe.  You develop painful lumps near the wound or on skin that is anywhere on your body.  You have a red streak going away from your wound.  The wound is on your hand or foot and you cannot properly move a finger or toe.  The wound is on your hand or foot and you notice that your fingers or toes look pale or bluish.   This information is not intended to replace advice given to you by your health care provider. Make sure you discuss any questions you have with your health care provider.   Document Released: 08/23/2005 Document Revised: 01/07/2015 Document Reviewed: 08/19/2014 Elsevier Interactive Patient  Education [employer]

## 2016-02-21 NOTE — ED Provider Notes (Signed)
CSN: [id number]     Arrival date & time 02/20/16  2338 History   First MD Initiated Contact with Patient 02/21/16 0257     Chief Complaint  Patient presents with  . Facial Laceration     (Consider location/radiation/quality/duration/timing/severity/associated sxs/prior Treatment) HPI Comments: 30 year old female with no significant past medical history presents to the emergency department for evaluation of a laceration to her forehead. Patient states that she tripped after stepping on her child's hot wheels car causing her to fall forward into the banister of a staircase. She denies loss of consciousness as well as nausea or vomiting. She has complaints of some mild headache which has been worsening since the time of the incident. No c/o vision changes or eye pain. Patient cannot recall the date of her last tetanus shot.  The history is provided by the patient. No language interpreter was used.    Past Medical History  Diagnosis Date  . Medical history non-contributory    Past Surgical History  Procedure Laterality Date  . Tongue surgery     Family History  Problem Relation Age of Onset  . Cancer Maternal Grandmother   . Diabetes Maternal Grandfather    Social History  Substance Use Topics  . Smoking status: Former Games developer  . Smokeless tobacco: None  . Alcohol Use: No   OB History    Gravida Para Term Preterm AB TAB SAB Ectopic Multiple Living   0 1 0 0 2      Review of Systems  Eyes: Negative for pain.  Gastrointestinal: Negative for nausea and vomiting.  Skin: Positive for wound.  Neurological: Positive for headaches. Negative for syncope.  All other systems reviewed and are negative.   Allergies  Demerol  Home Medications   Prior to Admission medications   Medication Sig Start Date End Date Taking? Authorizing Provider  ferrous sulfate 325 (65 FE) MG tablet Take 1 tablet (325 mg total) by mouth 2 (two) times daily with a meal. Patient not taking:  Reported on 02/21/2016 11/21/15   Venus Standard, CNM  ibuprofen (ADVIL,MOTRIN) 600 MG tablet Take 1 tablet (600 mg total) by mouth every 6 (six) hours. Patient not taking: Reported on 02/21/2016 11/21/15   Venus Standard, CNM   BP 114/70 mmHg  Pulse 88  Temp(Src) 98.3 F (36.8 C) (Oral)  Resp 18  SpO2 99%  LMP 02/14/2016   Physical Exam  Constitutional: She is oriented to person, place, and time. She appears well-developed and well-nourished. No distress.  Nontoxic/nonseptic appearing  HENT:  Head: Normocephalic. Head is without raccoon's eyes and without Battle's sign.    No Battle sign or raccoons eyes  Eyes: Conjunctivae and EOM are normal. No scleral icterus.  Neck: Normal range of motion.  Cardiovascular: Normal rate, regular rhythm and intact distal pulses.   Pulmonary/Chest: Effort normal. No respiratory distress.  Respirations even and unlabored  Musculoskeletal: Normal range of motion.  Neurological: She is alert and oriented to person, place, and time. She exhibits normal muscle tone. Coordination normal.  GCS 15. No focal neurologic deficits appreciated.  Skin: Skin is warm and dry. No rash noted. She is not diaphoretic. No erythema. No pallor.  Psychiatric: She has a normal mood and affect. Her behavior is normal.  Nursing note and vitals reviewed.   ED Course  Procedures (including critical care time) Labs Review Labs Reviewed - No data to display  Imaging Review No results found.   I have personally reviewed and  evaluated these images and lab results as part of my medical decision-making.   EKG Interpretation None       LACERATION REPAIR Performed by: Antony MaduraHUMES, Carlita Whitcomb Authorized by: Antony MaduraHUMES, Krystine Pabst Consent: Verbal consent obtained. Risks and benefits: risks, benefits and alternatives were discussed Consent given by: patient Patient identity confirmed: provided demographic data Prepped and Draped in normal sterile fashion Wound explored  Laceration  Location: above R lateral eyebrow  Laceration Length: 2cm  No Foreign Bodies seen or palpated  Anesthesia: local infiltration  Local anesthetic: lidocaine 2% with epinephrine  Anesthetic total: 2 ml  Irrigation method: syringe Amount of cleaning: standard  Skin closure: 4-0 prolene  Number of sutures: 3  Technique: simple interrupted  Patient tolerance: Patient tolerated the procedure well with no immediate complications.   MDM   Final diagnoses:  Laceration of forehead without complication, initial encounter    Patient presents for laceration secondary to a fall. No associated loss of consciousness or concussive symptoms. Tdap unknown. Patient declines updating tetanus today. I have discussed the risks of declining this test today including, but not limited to, development of tetanus or death. Patient verbalizes understanding of these facts and continues to decline tetanus booster. She has the capacity to make this decision and states that she would rather check the status of this with her primary doctor.  Pressure irrigation performed. Laceration occurred < 8 hours prior to repair which was well tolerated. Pt has no comorbidities to effect normal wound healing. Discussed suture home care with pt and answered questions. Pt to follow up for wound check and suture removal in 7 days. Pt is hemodynamically stable with no complaints prior to discharge.     Filed Vitals:   02/21/16 0002 02/21/16 0300 02/21/16 0330 02/21/16 0400  BP: 114/70 124/80 117/69 113/63  Pulse: 88 92 84 84  Temp: 98.3 F (36.8 C)     TempSrc: Oral     Resp: 18     SpO2: 99% 99% 97% 98%     Antony MaduraKelly Lashaunda Schild, PA-C 02/21/16 0420  Derwood KaplanAnkit Nanavati, MD 02/21/16 [id number]

## 2016-02-21 NOTE — ED Notes (Signed)
PA at bedside.

## 2016-02-21 NOTE — ED Notes (Signed)
Pt stepped on her child's Hot wheels car and fell against banister of stairs.  Approx 1 inch laceration lateral to R eyebrow.  Denies LOC.  No neck or back pain.

## 2016-02-28 ENCOUNTER — Encounter (HOSPITAL_COMMUNITY): Payer: Self-pay

## 2016-02-28 ENCOUNTER — Emergency Department (HOSPITAL_COMMUNITY)
Admission: EM | Admit: 2016-02-28 | Discharge: 2016-02-28 | Disposition: A | Discharge status: Home / Self Care | Payer: Medicaid Other | Attending: Emergency Medicine | Admitting: Emergency Medicine

## 2016-02-28 DIAGNOSIS — Z87891 Personal history of nicotine dependence: Secondary | ICD-10-CM | POA: Insufficient documentation

## 2016-02-28 DIAGNOSIS — Z4802 Encounter for removal of sutures: Secondary | ICD-10-CM | POA: Diagnosis not present

## 2016-02-28 NOTE — ED Notes (Signed)
Patient herfe to have sutures removed from forehead, in place x 1 week

## 2016-02-28 NOTE — ED Notes (Signed)
Declined W/C at D/C and was escorted to lobby by RN. 

## 2016-02-28 NOTE — ED Provider Notes (Signed)
CSN: [id number]     Arrival date & time 02/28/16  1204 History  By signing my name below, I, Tanda RockersMargaux Venter, attest that this documentation has been prepared under the direction and in the presence of Audry Piliyler Netanel Yannuzzi, PA-C.  Electronically Signed: Tanda RockersMargaux Venter, ED Scribe. 02/28/2016. 12:57 PM.   No chief complaint on file.  The history is provided by the patient. No language interpreter was used.    HPI Comments: Allison Nielsen is a 30 y.o. female who presents to the Emergency Department for suture removal. Pt was seen in the ED on 02/21/2016 (approximately 7 days ago) for laceration to forehead after tripping and hitting head on stairs. She had 3 sutures placed and was told to return in 7 days for suture removal. She states that the wound has been healing well. She does note mild itchiness to the area but denies any pain. Denies fever, chills, redness, drainage, or any other associated symptoms.   Past Medical History  Diagnosis Date  . Medical history non-contributory    Past Surgical History  Procedure Laterality Date  . Tongue surgery     Family History  Problem Relation Age of Onset  . Cancer Maternal Grandmother   . Diabetes Maternal Grandfather    Social History  Substance Use Topics  . Smoking status: Former Games developermoker  . Smokeless tobacco: None  . Alcohol Use: No   OB History    Gravida Para Term Preterm AB TAB SAB Ectopic Multiple Living   5 4 3 1 1  0 1 0 0 2     Review of Systems  Constitutional: Negative for fever and chills.  Skin: Positive for wound. Negative for color change.   Allergies  Demerol  Home Medications   Prior to Admission medications   Medication Sig Start Date End Date Taking? Authorizing Provider  ferrous sulfate 325 (65 FE) MG tablet Take 1 tablet (325 mg total) by mouth 2 (two) times daily with a meal. Patient not taking: Reported on 02/21/2016 11/21/15   Venus Standard, CNM  ibuprofen (ADVIL,MOTRIN) 600 MG tablet Take 1 tablet (600 mg total) by  mouth every 6 (six) hours. Patient not taking: Reported on 02/21/2016 11/21/15   Venus Standard, CNM   BP 125/73 mmHg  Pulse 78  Temp(Src) 98.1 F (36.7 C) (Oral)  Resp 18  SpO2 98%  LMP 02/14/2016   Physical Exam  Constitutional: She is oriented to person, place, and time. She appears well-developed and well-nourished. No distress.  HENT:  Head: Normocephalic and atraumatic.  1 cm laceration to right forehead that is well healed, no bleeding or signs of infection. 3 sutures noted.   Eyes: Conjunctivae and EOM are normal.  Neck: Neck supple. No tracheal deviation present.  Cardiovascular: Normal rate.   Pulmonary/Chest: Effort normal. No respiratory distress.  Musculoskeletal: Normal range of motion.  Neurological: She is alert and oriented to person, place, and time.  Skin: Skin is warm and dry.  Psychiatric: She has a normal mood and affect. Her behavior is normal.  Nursing note and vitals reviewed.   ED Course  Procedures (including critical care time)  DIAGNOSTIC STUDIES: Oxygen Saturation is 98% on RA, normal by my interpretation.    COORDINATION OF CARE: 12:57 PM-Discussed treatment plan which includes suture removal with pt at bedside and pt agreed to plan.    MDM   Final diagnoses:  Visit for suture removal   Staple removal   Pt to ER for staple/suture removal and wound check as above.  Procedure tolerated well. Vitals normal, no signs of infection. Scar minimization & return precautions given at dc.    I personally performed the services described in this documentation, which was scribed in my presence. The recorded information has been reviewed and is accurate.      Audry Piliyler Sigrid Schwebach, PA-C 02/28/16 1302  Cathren LaineKevin Steinl, MD 02/28/16 [phone]

## 2017-01-14 IMAGING — US US MFM OB FOLLOW-UP
1 series · 14 of 28 positions shown · non-contrast
Comparison: none

[Series 1: us mfm ob follow-up · 14 of 36 slices shown]
[im 2/36]
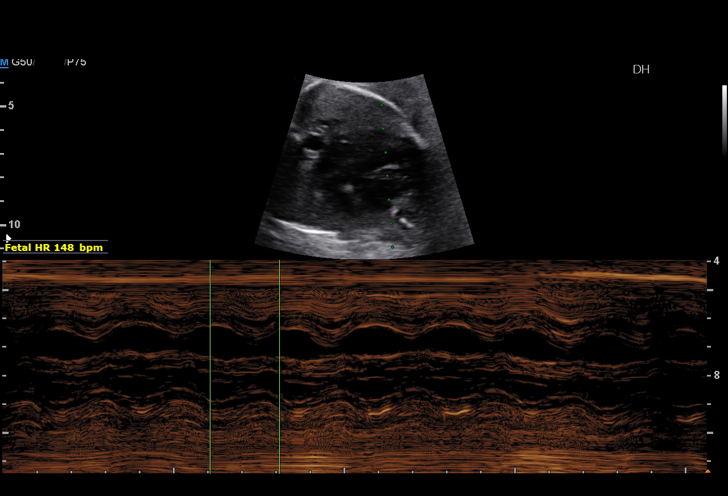
[im 4/36]
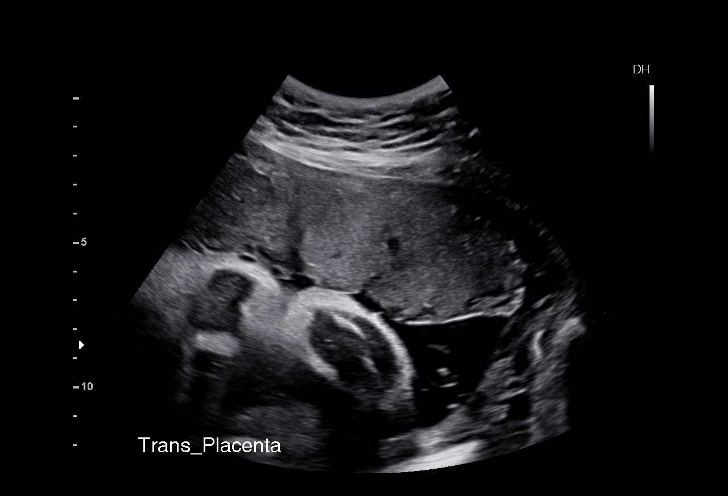
[im 7/36]
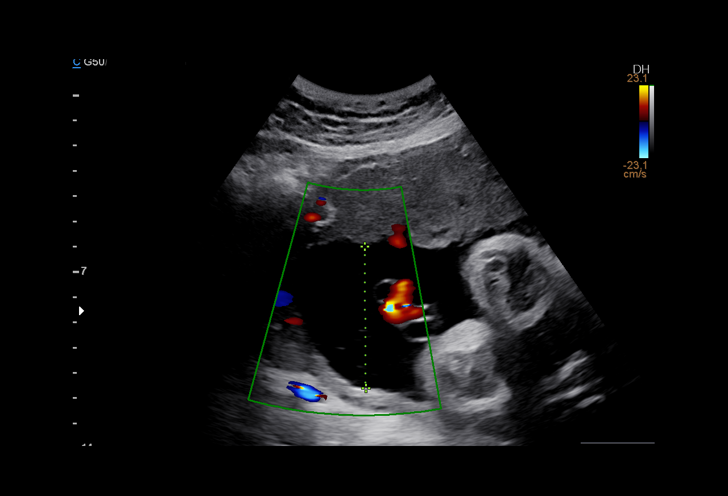
[im 10/36]
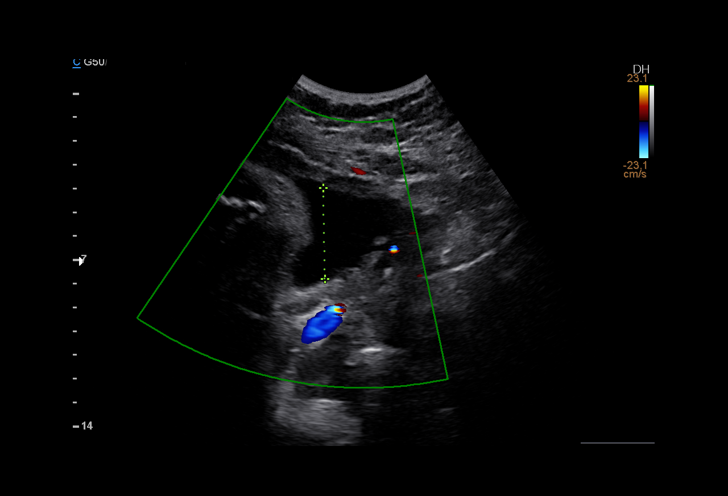
[im 12/36]
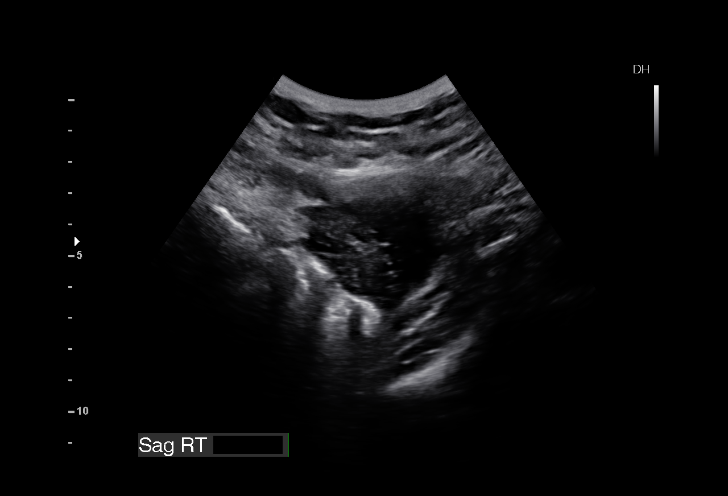
[im 15/36]
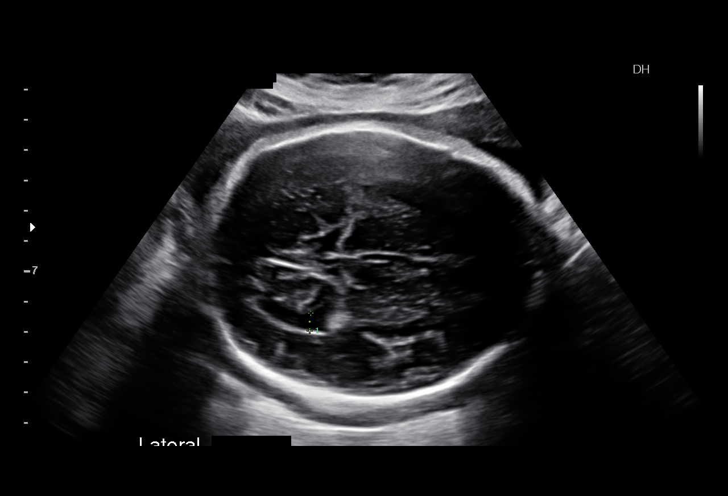
[im 17/36]
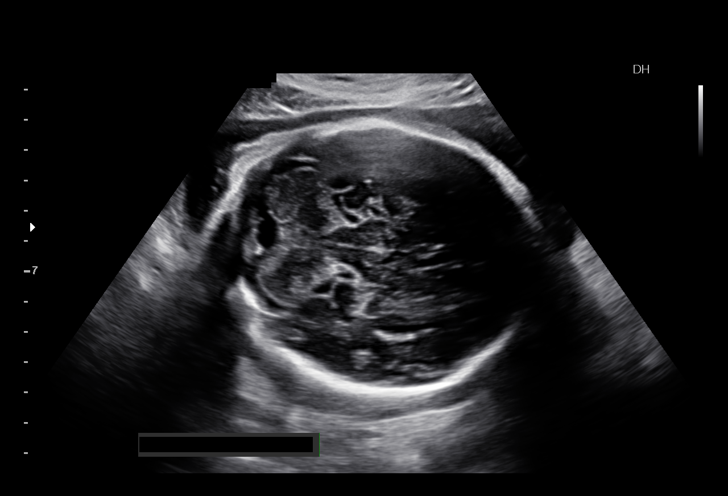
[im 20/36]
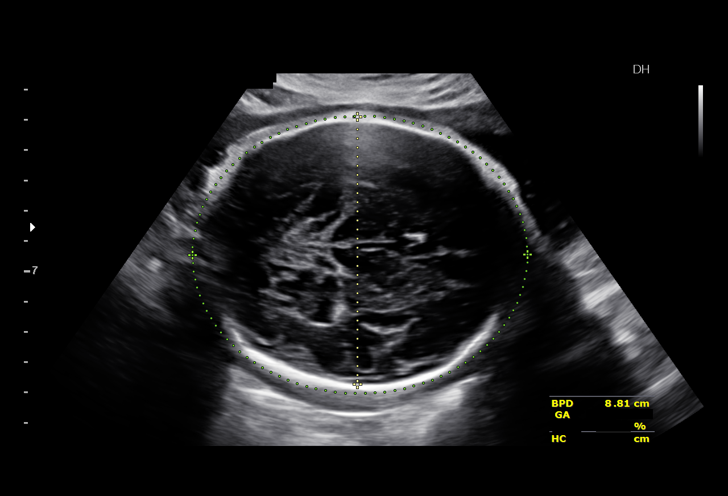
[im 23/36]
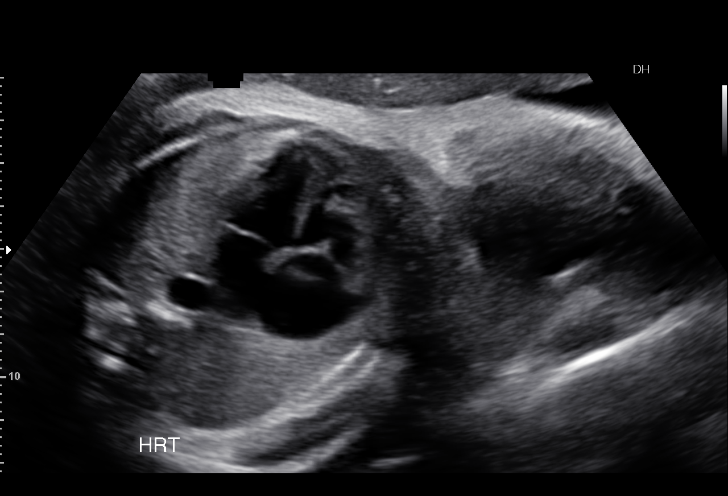
[im 25/36]
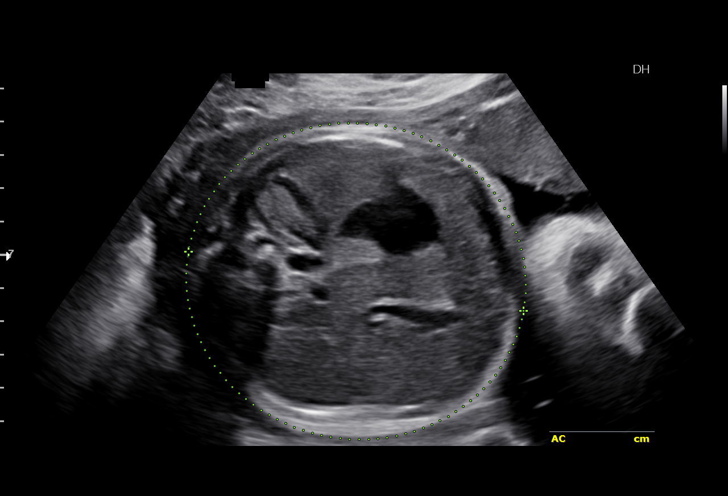
[im 28/36]
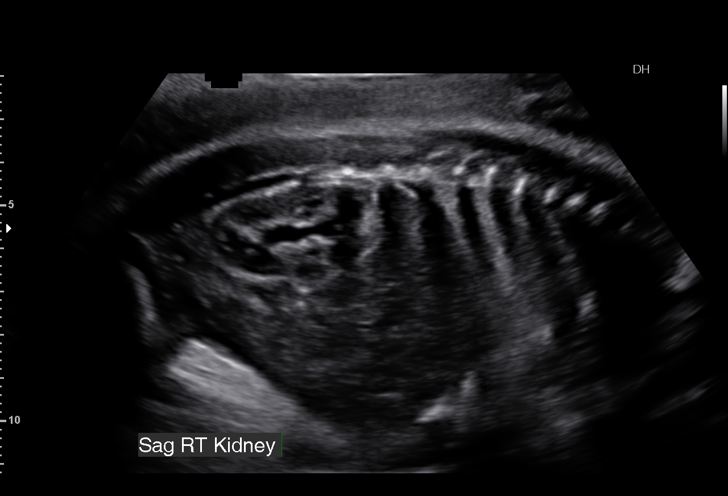
[im 30/36]
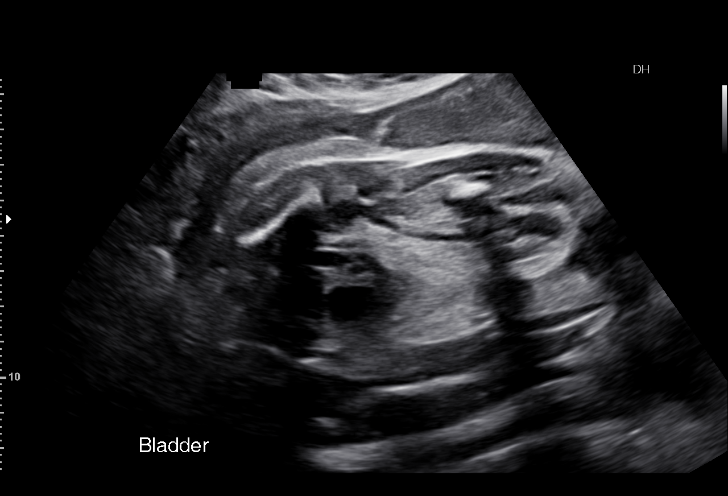
[im 33/36]
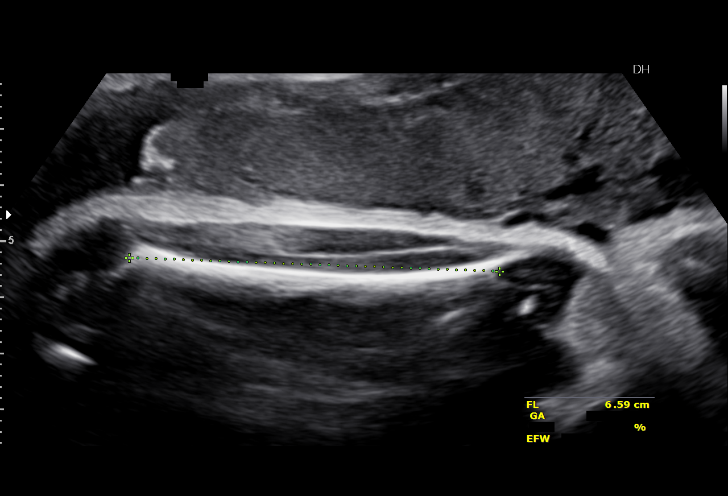
[im 36/36]
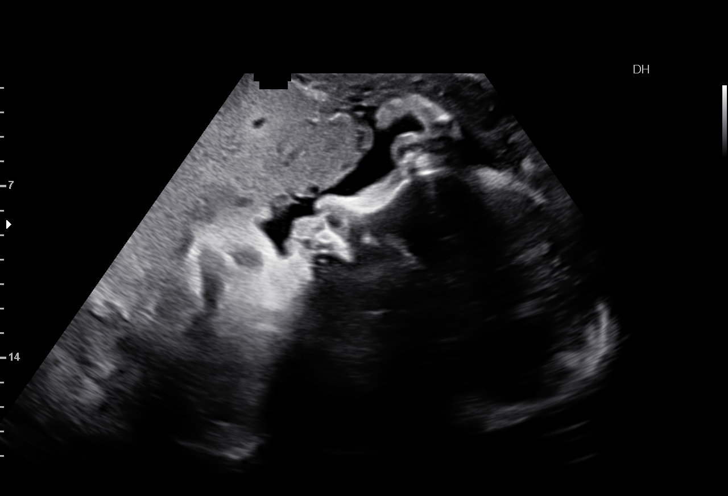

[14 of 28 positions shown; findings below may reference images not displayed]

Obstetrics &
Gynecology
[address]
[place].

1  MIAH CHIN           [id number]       [id number]     [id number]
Indications

34 weeks gestation of pregnancy
Pyelectasis of fetus on prenatal ultrasound
Cerebral ventriculomegaly
Late to prenatal care, third trimester
OB History

Gravidity:    5         Term:   3        Prem:   0        SAB:   1
TOP:          0       Ectopic:  0        Living: 3
Fetal Evaluation

Num Of Fetuses:     1
Fetal Heart         148
Rate(bpm):
Cardiac Activity:   Observed
Presentation:       Cephalic
Placenta:           Anterior, above cervical os
P. Cord Insertion:  Previously Visualized

Amniotic Fluid
AFI FV:      Subjectively within normal limits
AFI Sum:     11.86   cm      34   %Tile     Larg Pckt:     5.6  cm
RUQ:   5.6    cm    RLQ:    0      cm    LUQ:   3.83    cm   LLQ:    2.43   cm
Biometry
BPD:      88.2  mm     G. Age:  35w 5d                  CI:        76.33   %   70 - 86
FL/HC:      20.7   %   20.1 -
HC:      319.9  mm     G. Age:  36w 1d         53  %    HC/AC:      1.03       0.93 -
AC:      309.8  mm     G. Age:  34w 6d         65  %    FL/BPD:     75.2   %   71 - 87
FL:       66.3  mm     G. Age:  34w 1d         30  %    FL/AC:      21.4   %   20 - 24
HUM:      61.5  mm     G. Age:  35w 4d         83  %
CM:        6.1  mm
Est. FW:    4640  gm    5 lb 10 oz      67  %
Gestational Age

LMP:           34w 4d       Date:   03/07/15                 EDD:   12/12/15
U/S Today:     35w 2d                                        EDD:   12/07/15
Best:          34w 4d    Det. By:   LMP  (03/07/15)          EDD:   12/12/15
Anatomy

Cranium:          Appears normal         LVOT:             Previously seen
Fetal Cavum:      Appears normal         Aortic Arch:      Previously seen
Ventricles:       Appears normal         Ductal Arch:      Not well visualized
Choroid Plexus:   Previously seen        Diaphragm:        Previously seen
Cerebellum:       Appears normal         Stomach:          Appears normal, left
sided
Posterior Fossa:  Appears normal         Abdomen:          Appears normal
Nuchal Fold:      Not applicable (>20    Abdominal Wall:   Previously seen
wks GA)
Face:             Orbits and profile     Cord Vessels:     Previously seen
previously seen
Lips:             Previously seen        Kidneys:          Appear normal
Palate:           Previously seen        Bladder:          Appears normal
Fetal Thoracic:   Appears normal         Spine:            Appears normal
Heart:            Appears normal         Upper             Previously seen
(4CH, axis, and        Extremities:
situs)
RVOT:             Previously seen        Lower             Previously seen
Extremities:

Other:  Technically difficult due to advanced GA and fetal position.
Cervix Uterus Adnexa

Cervix
Not visualized (advanced GA >88wks)

Left Ovary
Within normal limits.

Right Ovary
Within normal limits.

Adnexa:       No abnormality visualized.
Impression

Single IUP at 34w 4d
Normal interval growth (67th %tile)
The cerebral ventricles appear normal (resolved
ventriculomegaly)
The remainder of the cranial anatomy appears normal
The previously noted urinary tract dilation has resolved -
kidneys appear normal bilaterally
Normal amniotic fluid volume
Recommendations

Follow-up ultrasounds as clinically indicated.
